# Patient Record
Sex: Male | Born: 1935 | Race: White | Hispanic: No | Smoking: Never smoker
Health system: Southern US, Community
[De-identification: ages and names within clinical notes are randomized; demographics above are authoritative.]

## PROBLEM LIST (undated history)

## (undated) DIAGNOSIS — I10 Essential (primary) hypertension: Secondary | ICD-10-CM

## (undated) DIAGNOSIS — R338 Other retention of urine: Secondary | ICD-10-CM

## (undated) DIAGNOSIS — S12100A Unspecified displaced fracture of second cervical vertebra, initial encounter for closed fracture: Secondary | ICD-10-CM

## (undated) HISTORY — DX: Other retention of urine: R33.8

## (undated) HISTORY — PX: OTHER SURGICAL HISTORY: SHX169

## (undated) HISTORY — DX: Essential (primary) hypertension: I10

## (undated) HISTORY — DX: Unspecified displaced fracture of second cervical vertebra, initial encounter for closed fracture: S12.100A

## (undated) HISTORY — PX: HERNIA REPAIR: SHX51

---

## 2013-12-09 DIAGNOSIS — S12100A Unspecified displaced fracture of second cervical vertebra, initial encounter for closed fracture: Secondary | ICD-10-CM | POA: Insufficient documentation

## 2013-12-09 HISTORY — DX: Unspecified displaced fracture of second cervical vertebra, initial encounter for closed fracture: S12.100A

## 2013-12-14 DIAGNOSIS — N400 Enlarged prostate without lower urinary tract symptoms: Secondary | ICD-10-CM | POA: Insufficient documentation

## 2013-12-14 DIAGNOSIS — I10 Essential (primary) hypertension: Secondary | ICD-10-CM

## 2013-12-14 DIAGNOSIS — R338 Other retention of urine: Secondary | ICD-10-CM

## 2013-12-14 HISTORY — DX: Other retention of urine: R33.8

## 2013-12-14 HISTORY — DX: Essential (primary) hypertension: I10

## 2015-02-10 ENCOUNTER — Encounter: Payer: Self-pay | Admitting: Urology

## 2015-02-10 ENCOUNTER — Encounter: Payer: Self-pay | Admitting: *Deleted

## 2015-02-10 ENCOUNTER — Other Ambulatory Visit: Payer: Self-pay

## 2015-02-10 ENCOUNTER — Ambulatory Visit (INDEPENDENT_AMBULATORY_CARE_PROVIDER_SITE_OTHER): Payer: Medicare Other | Admitting: Urology

## 2015-02-10 VITALS — BP 132/71 | HR 71 | Ht 70.0 in | Wt 180.6 lb

## 2015-02-10 DIAGNOSIS — N401 Enlarged prostate with lower urinary tract symptoms: Secondary | ICD-10-CM | POA: Diagnosis not present

## 2015-02-10 DIAGNOSIS — R972 Elevated prostate specific antigen [PSA]: Secondary | ICD-10-CM

## 2015-02-10 DIAGNOSIS — N138 Other obstructive and reflux uropathy: Secondary | ICD-10-CM | POA: Insufficient documentation

## 2015-02-10 DIAGNOSIS — R3913 Splitting of urinary stream: Secondary | ICD-10-CM

## 2015-02-10 DIAGNOSIS — N400 Enlarged prostate without lower urinary tract symptoms: Secondary | ICD-10-CM

## 2015-02-10 LAB — URINALYSIS, COMPLETE
Bilirubin, UA: NEGATIVE
GLUCOSE, UA: NEGATIVE
Ketones, UA: NEGATIVE
Leukocytes, UA: NEGATIVE
Nitrite, UA: NEGATIVE
PH UA: 7 (ref 5.0–7.5)
PROTEIN UA: NEGATIVE
Specific Gravity, UA: 1.015 (ref 1.005–1.030)
UUROB: 1 mg/dL (ref 0.2–1.0)

## 2015-02-10 LAB — MICROSCOPIC EXAMINATION
BACTERIA UA: NONE SEEN
Epithelial Cells (non renal): NONE SEEN /hpf (ref 0–10)

## 2015-02-10 NOTE — Progress Notes (Signed)
This is a new patient seen today for elevated PSA were for by Scott Maynard.  Patient was seen November 2016 with a PSA was 7.4. He has no family history of prostate cancer. No prior PSA or prostate biopsy.   He has a history of BPH on tamsulosin and has not. 2, hesitancy and intermittent stream at times. He's had no gross hematuria or dysuria.  Past medical and surgical history reviewed. Family history reviewed. Social history reviewed. Medication list reviewed.   13 system review of systems obtained which was negative apart from the following: Heartburn, back pain, joint pain  Physical exam: NAD GU: Circumcised penis without mass or lesion. Testicles were descended bilaterally and palpably normal, small right spermatocele palpable. On digital rectal exam the prostate was palpably normal without hard area or nodule. Approximately 50 g.    His UA is negative.    A / P:  Elevated PSA-a long discussion with the patient about the nature risk and benefits of PSA screening and the nature of elevated PSA. We discussed the management of prostate cancer might include watchful waiting, active surveillance or treatment primarily with surgery or radiation. Discussed the nature risk and benefits of surveillance versus prostate biopsy, other lab tests or imaging. Because he had a normal DRE and this is his first PSA will have him return in a couple months with a repeat PSA prior reflex to free.   BPH with lower urinary tract symptoms He'll continue tamsulosin. Optically bothered by any symptoms.

## 2015-04-17 ENCOUNTER — Other Ambulatory Visit: Payer: Medicare Other

## 2015-04-17 DIAGNOSIS — R972 Elevated prostate specific antigen [PSA]: Secondary | ICD-10-CM

## 2015-04-18 LAB — PSA TOTAL (REFLEX TO FREE): PROSTATE SPECIFIC AG, SERUM: 4.8 ng/mL — AB (ref 0.0–4.0)

## 2015-04-18 LAB — FPSA% REFLEX
% FREE PSA: 32.7 %
PSA, FREE: 1.57 ng/mL

## 2015-04-19 ENCOUNTER — Ambulatory Visit: Payer: Medicare Other

## 2015-04-21 ENCOUNTER — Telehealth: Payer: Self-pay

## 2015-04-21 NOTE — Telephone Encounter (Signed)
-----   Message from Jerilee Field, MD sent at 04/20/2015  5:03 PM EST ----- Notify patient PSA much lower. Give result. F/u as planned.

## 2015-04-21 NOTE — Telephone Encounter (Signed)
Spoke with pt in reference to PSA. Pt voiced understanding.  

## 2015-04-24 ENCOUNTER — Ambulatory Visit: Payer: Medicare Other

## 2015-04-28 ENCOUNTER — Ambulatory Visit (INDEPENDENT_AMBULATORY_CARE_PROVIDER_SITE_OTHER): Payer: Medicare Other | Admitting: Urology

## 2015-04-28 ENCOUNTER — Encounter: Payer: Self-pay | Admitting: Urology

## 2015-04-28 VITALS — BP 182/80 | HR 66 | Ht 70.0 in | Wt 183.0 lb

## 2015-04-28 DIAGNOSIS — R972 Elevated prostate specific antigen [PSA]: Secondary | ICD-10-CM

## 2015-04-28 NOTE — Progress Notes (Signed)
04/28/2015 4:09 PM   Michaela CornerBruce Lo 03/02/1930 [id number]  Referring provider: No referring provider defined for this encounter.  Chief Complaint  Patient presents with  . Elevated PSA    HPI: Patient was seen November 2016 with a PSA was 7.4. He has no family history of prostate cancer. No prior PSA or prostate biopsy. Repeat PSA is 4.8 with a free PSA of 32.7%.    He has a history of BPH on tamsulosin and has not. 2, hesitancy and intermittent stream at times. He's had no gross hematuria or dysuria.    PMH: Past Medical History  Diagnosis Date  . Essential (primary) hypertension 12/14/2013  . Fracture of second cervical vertebra (HCC) 12/09/2013  . Acute retention of urine 12/14/2013    Surgical History: Past Surgical History  Procedure Laterality Date  . Hernia repair    . Neck vertabrae      Home Medications:    Medication List       This list is accurate as of: 04/28/15  4:09 PM.  Always use your most recent med list.               Garlic 705 MG Caps  Take by mouth.     losartan-hydrochlorothiazide 100-25 MG tablet  Commonly known as:  HYZAAR  Take by mouth.     naproxen 500 MG tablet  Commonly known as:  NAPROSYN     Omega-3 1000 MG Caps  Take by mouth.     pantoprazole 40 MG tablet  Commonly known as:  PROTONIX  Take 40 mg by mouth.     tamsulosin 0.4 MG Caps capsule  Commonly known as:  FLOMAX  Take 0.4 mg by mouth.     terbinafine 250 MG tablet  Commonly known as:  LAMISIL     vitamin C 1000 MG tablet  Take by mouth. Reported on 02/10/2015        Allergies: No Known Allergies  Family History: Family History  Problem Relation Age of Onset  . Hematuria Neg Hx   . Prostate cancer Neg Hx   . Kidney cancer Neg Hx     Social History:  reports that he has never smoked. He does not have any smokeless tobacco history on file. He reports that he does not drink alcohol or use illicit drugs.  ROS: UROLOGY Frequent Urination?:  No Hard to postpone urination?: No Burning/pain with urination?: No Get up at night to urinate?: Yes Leakage of urine?: No Urine stream starts and stops?: No Trouble starting stream?: No Do you have to strain to urinate?: No Blood in urine?: No Urinary tract infection?: No Sexually transmitted disease?: No Injury to kidneys or bladder?: No Painful intercourse?: No Weak stream?: No Erection problems?: No Penile pain?: No  Gastrointestinal Nausea?: No Vomiting?: No Indigestion/heartburn?: No Diarrhea?: No Constipation?: No  Constitutional Fever: No Night sweats?: No Weight loss?: No Fatigue?: No  Skin Skin rash/lesions?: No Itching?: No  Eyes Blurred vision?: No Double vision?: No  Ears/Nose/Throat Sore throat?: No Sinus problems?: No  Hematologic/Lymphatic Swollen glands?: No Easy bruising?: No  Cardiovascular Leg swelling?: No Chest pain?: No  Respiratory Cough?: No Shortness of breath?: No  Endocrine Excessive thirst?: No  Musculoskeletal Back pain?: No Joint pain?: No  Neurological Headaches?: No Dizziness?: No  Psychologic Depression?: No Anxiety?: No  Physical Exam: BP 182/80 mmHg  Pulse 66  Ht 5\' 10"  (1.778 m)  Wt 183 lb (83.008 kg)  BMI 26.26 kg/m2  Constitutional:  Alert  and oriented, No acute distress. HEENT: [place] AT, moist mucus membranes.  Trachea midline, no masses. Cardiovascular: No clubbing, cyanosis, or edema. Respiratory: Normal respiratory effort, no increased work of breathing. GI: Abdomen is soft, nontender, nondistended, no abdominal masses GU: No CVA tenderness. Skin: No rashes, bruises or suspicious lesions. Lymph: No cervical or inguinal adenopathy. Neurologic: Grossly intact, no focal deficits, moving all 4 extremities. Psychiatric: Normal mood and affect.  Laboratory Data: No results found for: WBC, HGB, HCT, MCV, PLT  No results found for: CREATININE  No results found for: PSA  No results found for:  TESTOSTERONE  No results found for: HGBA1C  Urinalysis    Component Value Date/Time   GLUCOSEU Negative 02/10/2015 0921   BILIRUBINUR Negative 02/10/2015 0921   NITRITE Negative 02/10/2015 0921   LEUKOCYTESUR Negative 02/10/2015 0921     Assessment & Plan:     1. Elevated PSA  renal long discussion regarding the patient's elevated PSA. We discussed his age and is relatively low PSA of currently of only 4.8 (free PSA 37%) with a negative DRE. We discussed the likelihood of him having clinically significant prostate cancer in his lifetime is extremely low. I think it would be absolutely reasonable for the patient to stop checking PSA all together. I discussed this with the patient.  I also offered repeating his PSA in one year to offer him piece of mind. He does understand there is an extremely low chance of developing clinically significant prostate cancer in his lifetime, however he has elected to repeat his PSA in 1 year's time.  2. BPH with lower urinary tract symptoms -Continue flomax  Return in about 1 year (around 04/27/2016) for psa prior.  Hildred Laser, MD  [facility] [address], [address] [place], [place] [address] [phone]

## 2016-04-24 ENCOUNTER — Other Ambulatory Visit: Payer: Medicare Other

## 2016-04-26 ENCOUNTER — Ambulatory Visit: Payer: Medicare Other

## 2016-07-19 ENCOUNTER — Encounter: Payer: Self-pay | Admitting: Urology

## 2016-07-19 ENCOUNTER — Ambulatory Visit (INDEPENDENT_AMBULATORY_CARE_PROVIDER_SITE_OTHER): Payer: Medicare Other | Admitting: Urology

## 2016-07-19 VITALS — BP 165/83 | HR 75 | Ht 70.0 in | Wt 178.5 lb

## 2016-07-19 DIAGNOSIS — N401 Enlarged prostate with lower urinary tract symptoms: Secondary | ICD-10-CM

## 2016-07-19 DIAGNOSIS — N138 Other obstructive and reflux uropathy: Secondary | ICD-10-CM | POA: Diagnosis not present

## 2016-07-19 LAB — URINALYSIS, COMPLETE
Bilirubin, UA: NEGATIVE
GLUCOSE, UA: NEGATIVE
Leukocytes, UA: NEGATIVE
NITRITE UA: NEGATIVE
RBC, UA: NEGATIVE
SPEC GRAV UA: 1.02 (ref 1.005–1.030)
UUROB: 1 mg/dL (ref 0.2–1.0)
pH, UA: 6 (ref 5.0–7.5)

## 2016-07-19 LAB — MICROSCOPIC EXAMINATION
BACTERIA UA: NONE SEEN
RBC, UA: NONE SEEN /hpf (ref 0–?)

## 2016-07-19 LAB — BLADDER SCAN AMB NON-IMAGING: SCAN RESULT: 85

## 2016-07-19 MED ORDER — FINASTERIDE 5 MG PO TABS: 5.0000 mg | ORAL_TABLET | Freq: Every day | ORAL | 11 refills | Status: DC

## 2016-07-19 MED ORDER — FINASTERIDE 5 MG PO TABS: 5.0000 mg | ORAL_TABLET | Freq: Every day | ORAL | 11 refills | Status: AC

## 2016-07-19 MED ORDER — TAMSULOSIN HCL 0.4 MG PO CAPS: 0.8000 mg | ORAL_CAPSULE | Freq: Every day | ORAL | 11 refills | Status: DC

## 2016-07-19 MED ORDER — TAMSULOSIN HCL 0.4 MG PO CAPS: 0.8000 mg | ORAL_CAPSULE | Freq: Every day | ORAL | 11 refills | Status: AC

## 2016-07-19 NOTE — Progress Notes (Signed)
07/19/2016 11:29 AM   Scott Maynard [birth year] [id number]  Referring provider: No referring provider defined for this encounter.  Chief Complaint  Patient presents with  . Benign Prostatic Hypertrophy    HPI: The patient is an [age] year old gentleman with a past medical history the BPH on Flomax who presents today with a chief complaint of difficulty with urination.  The patient's IPSS score today is 21/3. Has nocturia 2. His biggest complaints are feeling of incomplete emptying, frequency, and weak stream. He does have moderate intermittency and urgency. This all started relatively recently. He was started on spironolactone recently for high blood pressure. He was [facility] with [facility] blood pressure. At this point, his Flomax was decreased from 0.8 mg to 0.4 mg. He noticed a significant worsening of his symptoms at this time. He feels that his symptoms are much better a few weeks ago.   PMH: Past Medical History:  Diagnosis Date  . Acute retention of urine 12/14/2013  . Essential (primary) hypertension 12/14/2013  . Fracture of second cervical vertebra (HCC) 12/09/2013    Surgical History: Past Surgical History:  Procedure Laterality Date  . HERNIA REPAIR    . neck vertabrae      Home Medications:  Allergies as of 07/19/2016   No Known Allergies     Medication List       Accurate as of 07/19/16 11:29 AM. Always use your most recent med list.          finasteride 5 MG tablet Commonly known as:  PROSCAR Take 1 tablet (5 mg total) by mouth daily.   Garlic 705 MG Caps Take by mouth.   losartan-hydrochlorothiazide 100-25 MG tablet Commonly known as:  HYZAAR Take by mouth.   naproxen 500 MG tablet Commonly known as:  NAPROSYN   Omega-3 1000 MG Caps Take by mouth.   pantoprazole 40 MG tablet Commonly known as:  PROTONIX Take 40 mg by mouth.   spironolactone 25 MG tablet Commonly known as:  ALDACTONE Take 25 mg by mouth daily.     tamsulosin 0.4 MG Caps capsule Commonly known as:  FLOMAX Take 0.4 mg by mouth.   tamsulosin 0.4 MG Caps capsule Commonly known as:  FLOMAX Take 2 capsules (0.8 mg total) by mouth daily.   terbinafine 250 MG tablet Commonly known as:  LAMISIL   vitamin C 1000 MG tablet Take by mouth. Reported on 02/10/2015       Allergies: No Known Allergies  Family History: Family History  Problem Relation Age of Onset  . Hematuria Neg Hx   . Prostate cancer Neg Hx   . Kidney cancer Neg Hx     Social History:  reports that he has never smoked. He has never used smokeless tobacco. He reports that he does not drink alcohol or use drugs.  ROS: UROLOGY Frequent Urination?: Yes Hard to postpone urination?: No Burning/pain with urination?: Yes Get up at night to urinate?: Yes Leakage of urine?: No Urine stream starts and stops?: Yes Trouble starting stream?: Yes Do you have to strain to urinate?: No Blood in urine?: No Urinary tract infection?: No Sexually transmitted disease?: No Injury to kidneys or bladder?: No Painful intercourse?: No Weak stream?: No Erection problems?: Yes Penile pain?: No  Gastrointestinal Nausea?: No Vomiting?: No Indigestion/heartburn?: No Diarrhea?: No Constipation?: No  Constitutional Fever: No Night sweats?: No Weight loss?: No Fatigue?: No  Skin Skin rash/lesions?: No Itching?: No  Eyes Blurred vision?: No Double vision?:  No  Ears/Nose/Throat Sore throat?: No Sinus problems?: No  Hematologic/Lymphatic Swollen glands?: No Easy bruising?: No  Cardiovascular Leg swelling?: No Chest pain?: No  Respiratory Cough?: No Shortness of breath?: No  Endocrine Excessive thirst?: No  Musculoskeletal Back pain?: Yes Joint pain?: No  Neurological Headaches?: No Dizziness?: No  Psychologic Depression?: No Anxiety?: No  Physical Exam: BP (!) 165/83 (BP Location: Left Arm, Patient Position: Sitting, Cuff Size: Normal)    Pulse 75   Ht 5\' 10"  (1.778 m)   Wt 178 lb 8 oz (81 kg)   BMI 25.61 kg/m   Constitutional:  Alert and oriented, No acute distress. HEENT:  AT, moist mucus membranes.  Trachea midline, no masses. Cardiovascular: No clubbing, cyanosis, or edema. Respiratory: Normal respiratory effort, no increased work of breathing. GI: Abdomen is soft, nontender, nondistended, no abdominal masses GU: No CVA tenderness.  Skin: No rashes, bruises or suspicious lesions. Lymph: No cervical or inguinal adenopathy. Neurologic: Grossly intact, no focal deficits, moving all 4 extremities. Psychiatric: Normal mood and affect.  Laboratory Data: No results found for: WBC, HGB, HCT, MCV, PLT  No results found for: CREATININE  No results found for: PSA  No results found for: TESTOSTERONE  No results found for: HGBA1C  Urinalysis    Component Value Date/Time   APPEARANCEUR Clear 02/10/2015 0921   GLUCOSEU Negative 02/10/2015 0921   BILIRUBINUR Negative 02/10/2015 0921   PROTEINUR Negative 02/10/2015 0921   NITRITE Negative 02/10/2015 0921   LEUKOCYTESUR Negative 02/10/2015 0921    Assessment & Plan:    1. BPH I discussed with the patient that the likely relatively recent worsening of symptoms are probably combination of his spironolactone causing increased urine production thus greater frequency as well as his Flomax being decreased in dosage. His blood pressure today is actually on the high side, so I think it would be reasonable to increase his Flomax back to 0.8 mg daily. Have also started him on finasteride. He was cautioned that the finasteride will take up to 6 weeks to have a noticeable effect and up to 6 months to have maximal effect. He'll follow-up in 3 months to assess his progress.  Return in about 3 months (around 10/19/2016).  Hildred Laser, MD  [facility] [address] [place], [place] [address] [phone]

## 2016-07-19 NOTE — Addendum Note (Signed)
Addended by: Doree BarthelLOWE, CASANDRA on: 07/19/2016 11:37 AM   Modules accepted: Orders

## 2016-10-18 ENCOUNTER — Ambulatory Visit (INDEPENDENT_AMBULATORY_CARE_PROVIDER_SITE_OTHER): Payer: Medicare Other | Admitting: Urology

## 2016-10-18 VITALS — BP 167/83 | HR 89 | Ht 70.0 in | Wt 182.9 lb

## 2016-10-18 DIAGNOSIS — N400 Enlarged prostate without lower urinary tract symptoms: Secondary | ICD-10-CM

## 2016-10-18 NOTE — Progress Notes (Signed)
10/18/2016 2:41 PM   Scott Maynard 1931/01/02 [id number]  Referring provider: No referring provider defined for this encounter.  Chief Complaint  Patient presents with  . Follow-up    3 months    HPI: The patient is an 83 year old gentleman with a past medical history the BPH on Flomax who presents today with a chief complaint of difficulty with urination.  The patient's IPSS score today is 21/3. Has nocturia 2. His biggest complaints are feeling of incomplete emptying, frequency, and weak stream. He does have moderate intermittency and urgency. This all started relatively recently. He was started on spironolactone recently for high blood pressure. He was then admitted to the hospital at [facility] with low blood pressure. At this point, his Flomax was decreased from 0.8 mg to 0.4 mg. He noticed a significant worsening of his symptoms at this time. He feels that his symptoms are much better a few weeks ago.    Since his blood pressure is high at the time of his last visit, his Flomax was increased back to 0.8 mg daily. He was also started on finasteride. His symptoms have dramatically improved. His only complaint now is nocturia 2. He does strain to start urination at night only. Otherwise he feels he empties his bladder without straining. He has a good stream. He is overall happy with his urinary quality of life.  The patient did note today that his wife did pass away in March 2018, and he now lives by himself   PMH: Past Medical History:  Diagnosis Date  . Acute retention of urine 12/14/2013  . Essential (primary) hypertension 12/14/2013  . Fracture of second cervical vertebra (HCC) 12/09/2013    Surgical History: Past Surgical History:  Procedure Laterality Date  . HERNIA REPAIR    . neck vertabrae      Home Medications:  Allergies as of 10/18/2016   No Known Allergies     Medication List       Accurate as of 10/18/16  2:41 PM. Always use your most recent med list.          finasteride 5 MG tablet Commonly known as:  PROSCAR Take 1 tablet (5 mg total) by mouth daily.   Garlic 705 MG Caps Take by mouth.   losartan-hydrochlorothiazide 100-25 MG tablet Commonly known as:  HYZAAR Take by mouth.   Omega-3 1000 MG Caps Take by mouth.   spironolactone 25 MG tablet Commonly known as:  ALDACTONE Take 25 mg by mouth daily.   tamsulosin 0.4 MG Caps capsule Commonly known as:  FLOMAX Take 2 capsules (0.8 mg total) by mouth daily.   vitamin B-12 100 MCG tablet Commonly known as:  CYANOCOBALAMIN Take 100 mcg by mouth daily.   vitamin C 1000 MG tablet Take by mouth. Reported on 02/10/2015   Vitamin D3 1000 units Caps Take by mouth.       Allergies: No Known Allergies  Family History: Family History  Problem Relation Age of Onset  . Hematuria Neg Hx   . Prostate cancer Neg Hx   . Kidney cancer Neg Hx     Social History:  reports that he has never smoked. He has never used smokeless tobacco. He reports that he does not drink alcohol or use drugs.  ROS: UROLOGY Frequent Urination?: No Hard to postpone urination?: No Burning/pain with urination?: No Get up at night to urinate?: Yes Leakage of urine?: No Urine stream starts and stops?: Yes Trouble starting stream?: Yes Do you have to  strain to urinate?: No Blood in urine?: No Urinary tract infection?: No Sexually transmitted disease?: No Injury to kidneys or bladder?: No Painful intercourse?: No Weak stream?: No Erection problems?: Yes Penile pain?: No  Gastrointestinal Nausea?: No Vomiting?: No Indigestion/heartburn?: No Diarrhea?: No Constipation?: No  Constitutional Fever: No Night sweats?: No Weight loss?: No Fatigue?: No  Skin Skin rash/lesions?: No Itching?: No  Eyes Blurred vision?: No Double vision?: No  Ears/Nose/Throat Sore throat?: No Sinus problems?: No  Hematologic/Lymphatic Swollen glands?: No Easy bruising?: No  Cardiovascular Leg swelling?:  No Chest pain?: No  Respiratory Cough?: No Shortness of breath?: No  Endocrine Excessive thirst?: No  Musculoskeletal Back pain?: Yes Joint pain?: Yes  Neurological Headaches?: No Dizziness?: Yes  Psychologic Depression?: No Anxiety?: No  Physical Exam: BP (!) 167/83 (BP Location: Left Arm, Patient Position: Sitting, Cuff Size: Normal)   Pulse 89   Ht 5\' 10"  (1.778 m)   Wt 182 lb 14.4 oz (83 kg)   BMI 26.24 kg/m   Constitutional:  Alert and oriented, No acute distress. HEENT: [place] AT, moist mucus membranes.  Trachea midline, no masses. Cardiovascular: No clubbing, cyanosis, or edema. Respiratory: Normal respiratory effort, no increased work of breathing. GI: Abdomen is soft, nontender, nondistended, no abdominal masses GU: No CVA tenderness.  Skin: No rashes, bruises or suspicious lesions. Lymph: No cervical or inguinal adenopathy. Neurologic: Grossly intact, no focal deficits, moving all 4 extremities. Psychiatric: Normal mood and affect.  Laboratory Data: No results found for: WBC, HGB, HCT, MCV, PLT  No results found for: CREATININE  No results found for: PSA  No results found for: TESTOSTERONE  No results found for: HGBA1C  Urinalysis    Component Value Date/Time   APPEARANCEUR Clear 07/19/2016 1059   GLUCOSEU Negative 07/19/2016 1059   BILIRUBINUR Negative 07/19/2016 1059   PROTEINUR Trace (A) 07/19/2016 1059   NITRITE Negative 07/19/2016 1059   LEUKOCYTESUR Negative 07/19/2016 1059    Assessment & Plan:    1. BPH The patient is doing well on Flomax or 0.8 mg daily and finasteride. He will follow-up with [place] in one year or sooner if needed.  Return in about 1 year (around 10/18/2017).  Hildred Laser, MD  [facility] [address] [place], [place] [address] [phone]

## 2017-10-17 ENCOUNTER — Ambulatory Visit: Payer: Medicare Other | Admitting: Urology

## 2017-12-18 ENCOUNTER — Emergency Department: Payer: Medicare Other

## 2017-12-18 ENCOUNTER — Other Ambulatory Visit: Payer: Self-pay

## 2017-12-18 ENCOUNTER — Encounter: Payer: Self-pay | Admitting: Emergency Medicine

## 2017-12-18 ENCOUNTER — Emergency Department
Admission: EM | Admit: 2017-12-18 | Discharge: 2017-12-18 | Disposition: A | Discharge status: Home / Self Care | Payer: Medicare Other | Attending: Emergency Medicine | Admitting: Emergency Medicine

## 2017-12-18 DIAGNOSIS — R079 Chest pain, unspecified: Secondary | ICD-10-CM | POA: Diagnosis not present

## 2017-12-18 DIAGNOSIS — R338 Other retention of urine: Secondary | ICD-10-CM | POA: Insufficient documentation

## 2017-12-18 DIAGNOSIS — Z79899 Other long term (current) drug therapy: Secondary | ICD-10-CM | POA: Diagnosis not present

## 2017-12-18 DIAGNOSIS — R5383 Other fatigue: Secondary | ICD-10-CM | POA: Diagnosis not present

## 2017-12-18 DIAGNOSIS — N401 Enlarged prostate with lower urinary tract symptoms: Secondary | ICD-10-CM | POA: Insufficient documentation

## 2017-12-18 DIAGNOSIS — I10 Essential (primary) hypertension: Secondary | ICD-10-CM | POA: Diagnosis not present

## 2017-12-18 DIAGNOSIS — R531 Weakness: Secondary | ICD-10-CM | POA: Diagnosis present

## 2017-12-18 LAB — CBC
HCT: 43.2 % (ref 39.0–52.0)
Hemoglobin: 15.3 g/dL (ref 13.0–17.0)
MCH: 31.9 pg (ref 26.0–34.0)
MCHC: 35.4 g/dL (ref 30.0–36.0)
MCV: 90.2 fL (ref 80.0–100.0)
NRBC: 0 % (ref 0.0–0.2)
PLATELETS: 182 10*3/uL (ref 150–400)
RBC: 4.79 MIL/uL (ref 4.22–5.81)
RDW: 12.4 % (ref 11.5–15.5)
WBC: 7.4 10*3/uL (ref 4.0–10.5)

## 2017-12-18 LAB — BASIC METABOLIC PANEL
ANION GAP: 9 (ref 5–15)
BUN: 11 mg/dL (ref 8–23)
CALCIUM: 8.9 mg/dL (ref 8.9–10.3)
CO2: 28 mmol/L (ref 22–32)
Chloride: 95 mmol/L — ABNORMAL LOW (ref 98–111)
Creatinine, Ser: 0.94 mg/dL (ref 0.61–1.24)
Glucose, Bld: 116 mg/dL — ABNORMAL HIGH (ref 70–99)
POTASSIUM: 4 mmol/L (ref 3.5–5.1)
Sodium: 132 mmol/L — ABNORMAL LOW (ref 135–145)

## 2017-12-18 LAB — URINALYSIS, COMPLETE (UACMP) WITH MICROSCOPIC
Bacteria, UA: NONE SEEN
Bilirubin Urine: NEGATIVE
GLUCOSE, UA: NEGATIVE mg/dL
HGB URINE DIPSTICK: NEGATIVE
KETONES UR: NEGATIVE mg/dL
LEUKOCYTES UA: NEGATIVE
NITRITE: NEGATIVE
PH: 7 (ref 5.0–8.0)
Protein, ur: NEGATIVE mg/dL
Specific Gravity, Urine: 1.011 (ref 1.005–1.030)
Squamous Epithelial / LPF: NONE SEEN (ref 0–5)
WBC, UA: NONE SEEN WBC/hpf (ref 0–5)

## 2017-12-18 LAB — TROPONIN I: Troponin I: <0.03 ng/mL (ref <0.03)

## 2017-12-18 NOTE — ED Triage Notes (Signed)
Pt reports this am he woke up early and then had to urinate about every 30 minutes.

## 2017-12-18 NOTE — Discharge Instructions (Addendum)
You are evaluated for fatigue and chest pain and although no certain cause was found, your exam and evaluation are overall reassuring in the emergency department today.  Return to the emergency department immediately for any worsening condition including chest pain, nausea, sweats, dizziness passing out, trouble breathing, weakness, numbness, or any other symptoms concerning to you.  Please follow-up with your primary care doctor about additional causes of fatigue which might include vitamin D or vitamin B12, etc.  You do need to follow with a cardiologist, the on-call cardiologist is Dr. Gwen Pounds with [facility] cardiology, but have also given you the number for Dr. Windell Hummingbird office [facility]) -or certainly you could see the cardiologist group in [place].

## 2017-12-18 NOTE — ED Triage Notes (Signed)
Pt reports has been feeling weak for the past week. Pt states this am he has some pain to left side of his chest and his left arm. Pt reports was seen at [facility] ED for the same a few days ago.

## 2017-12-18 NOTE — ED Provider Notes (Signed)
[facility] Provider Note ____________________________________________   I have reviewed the triage vital signs and the triage nursing note.  HISTORY  Chief Complaint Weakness and Chest Pain   Historian Patient and son at the bedside  HPI Scott Maynard is a 84 y.o. male presents with a complaint of nausea and left-sided chest pain that went to his arm this morning.  He has had generalized weakness for about a week now.  No recent illnesses.  No fever.  No congestion or sinus allergies.  No shortness of breath or coughing.  No focal weakness or numbness of the extremities.  No slurred speech.  Patient was seen at [facility] ER on Monday for symptoms of generalized weakness and states that his blood pressure was elevated and he increased from 2.5 mg twice daily of amlodipine to 5 mill grams twice daily of amlodipine.  The reason he came today was persistence of fatigue generally, but mainly for the chest discomfort which was mild and did not last that long but made him concerned.  He does have a cardiologist and has an appointment next Monday already scheduled.  Reports echo done last year, but never had a stress test.     Past Medical History:  Diagnosis Date  . Acute retention of urine 12/14/2013  . Essential (primary) hypertension 12/14/2013  . Fracture of second cervical vertebra (HCC) 12/09/2013    Patient Active Problem List   Diagnosis Date Noted  . 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 Intermittent urinary stream 02/10/2015  . Elevated PSA 02/10/2015  . Essential (primary) hypertension 12/14/2013  . Fracture of second cervical vertebra (HCC) 12/09/2013    Past Surgical History:  Procedure Laterality Date  . HERNIA REPAIR    . neck vertabrae      Prior to Admission medications   Medication Sig Start Date End Date Taking? Authorizing Provider  amLODipine (NORVASC) 2.5 MG tablet Take 2.5 mg by mouth  daily. 12/09/17  Yes [provider]  Ascorbic Acid (VITAMIN C) 1000 MG tablet Take by mouth. Reported on 02/10/2015    [provider]  Cholecalciferol (VITAMIN D3) 1000 units CAPS Take by mouth.    [provider]  finasteride (PROSCAR) 5 MG tablet Take 1 tablet (5 mg total) by mouth daily. 07/19/16   Hildred Laser, MD  Garlic 705 MG CAPS Take by mouth.    [provider]  losartan-hydrochlorothiazide (HYZAAR) 100-25 MG tablet Take by mouth. 05/30/14   [provider]  Omega-3 1000 MG CAPS Take by mouth.    [provider]  spironolactone (ALDACTONE) 25 MG tablet Take 25 mg by mouth daily.    [provider]  tamsulosin (FLOMAX) 0.4 MG CAPS capsule Take 2 capsules (0.8 mg total) by mouth daily. 07/19/16   Hildred Laser, MD  vitamin B-12 (CYANOCOBALAMIN) 100 MCG tablet Take 100 mcg by mouth daily.    [provider]    No Known Allergies  Family History  Problem Relation Age of Onset  . Hematuria Neg Hx   . Prostate cancer Neg Hx   . Kidney cancer Neg Hx     Social History Social History   Tobacco Use  . Smoking status: Never Smoker  . Smokeless tobacco: Never Used  Substance Use Topics  . Alcohol use: No    Alcohol/week: 0.0 standard drinks  . Drug use: No    Review of Systems  Constitutional: Negative for fever. Eyes: Negative for visual  changes. ENT: Negative for sore throat. Cardiovascular: As per HPI positive for chest pain. Respiratory: Negative for shortness of breath. Gastrointestinal: Negative for abdominal pain, vomiting and diarrhea. Genitourinary: Negative for dysuria.  Does have frequency of urination which is chronic and felt to be due to prostate issues. Musculoskeletal: Negative for back pain. Skin: Negative for rash. Neurological: Negative for headache.  ____________________________________________   PHYSICAL EXAM:  VITAL SIGNS: ED Triage Vitals  Enc Vitals Group      BP 12/18/17 0838 (!) 171/70     Pulse Rate 12/18/17 0838 73     Resp 12/18/17 0838 18     Temp 12/18/17 0838 97.7 F (36.5 C)     Temp Source 12/18/17 0838 Oral     SpO2 12/18/17 0838 97 %     Weight 12/18/17 0836 185 lb (83.9 kg)     Height 12/18/17 0836 5\' 8"  (1.727 m)     Head Circumference --      Peak Flow --      Pain Score 12/18/17 0836 5     Pain Loc --      Pain Edu? --      Excl. in GC? --      Constitutional: Alert and oriented.  HEENT      Head: Normocephalic and atraumatic.      Eyes: Conjunctivae are normal. Pupils equal and round.       Ears:         Nose: No congestion/rhinnorhea.      Mouth/Throat: Mucous membranes are moist.      Neck: No stridor. Cardiovascular/Chest: Normal rate, regular rhythm.  No murmurs, rubs, or gallops. Respiratory: Normal respiratory effort without tachypnea nor retractions. Breath sounds are clear and equal bilaterally. No wheezes/rales/rhonchi. Gastrointestinal: Soft. No distention, no guarding, no rebound. Nontender.    Genitourinary/rectal:Deferred Musculoskeletal: Nontender with normal range of motion in all extremities. No joint effusions.  No lower extremity tenderness.  No edema. Neurologic:  Normal speech and language. No gross or focal neurologic deficits are appreciated. Skin:  Skin is warm, dry and intact. No rash noted. Psychiatric: Mood and affect are normal. Speech and behavior are normal. Patient exhibits appropriate insight and judgment.   ____________________________________________  LABS (pertinent positives/negatives) I, Governor Rooks, MD the attending physician have reviewed the labs noted below.  Labs Reviewed  BASIC METABOLIC PANEL - Abnormal; Notable for the following components:      Result Value   Sodium 132 (*)    Chloride 95 (*)    Glucose, Bld 116 (*)    All other components within normal limits  URINALYSIS, COMPLETE (UACMP) WITH MICROSCOPIC - Abnormal; Notable for the following components:    Color, Urine YELLOW (*)    APPearance CLEAR (*)    All other components within normal limits  CBC  TROPONIN I    ____________________________________________    EKG I, Governor Rooks, MD, the attending physician have personally viewed and interpreted all ECGs.  77 bpm.  Normal sinus rhythm.  Narrow QS.  Left axis.  Normal ST and T wave. ____________________________________________  RADIOLOGY   Chest x-ray two-view: No edema or consolidation. __________________________________________  PROCEDURES  Procedure(s) performed: None  Procedures  Critical Care performed: None   ____________________________________________  ED COURSE / ASSESSMENT AND PLAN  Pertinent labs & imaging results that were available during my care of the patient were reviewed by me and considered in my medical decision making (see chart for details).     In terms of  the generalized fatigue, sound like he had a work-up a couple of days ago, and there is nothing clinically change with regard to that.  He is not reporting symptoms of GI bleeding, dehydration, infection respiratory or GI.  He has not been on any new medications other than change in his amlodipine dose due to elevated blood pressures on Monday.  Blood pressure today is around 150/80.  He was here because of the chest pain episode this morning.  No ongoing chest pain.  EKG shows a PVC but otherwise normal sinus rhythm.  He does follow-up with a cardiologist.  Laboratory studies are reassuring with hemoglobin of 15, white blood count is normal.  Elect lites normal.  Troponin negative.  No ongoing chest pain.  Symptoms had started around 3am -- so approx 5 hours prior to blood draw, I do not see additional value in repeat troponin at this point with no symptoms.  Discussed return precautions and follow up plan.    CONSULTATIONS:   None  Patient / Family / Caregiver informed of clinical course, medical decision-making process, and agree with  plan.   I discussed return precautions, follow-up instructions, and discharge instructions with patient and/or family.  Discharge Instructions : You are evaluated for fatigue and chest pain and although no certain cause was found, your exam and evaluation are overall reassuring in the emergency department today.  Return to the emergency department immediately for any worsening condition including chest pain, nausea, sweats, dizziness passing out, trouble breathing, weakness, numbness, or any other symptoms concerning to you.  Please follow-up with your primary care doctor about additional causes of fatigue which might include vitamin D or vitamin B12, etc.  You do need to follow with a cardiologist, the on-call cardiologist is Dr. Gwen Pounds with [facility] cardiology, but have also given you the number for Dr. Windell Hummingbird office [facility]) -or certainly you could see the cardiologist group in [place].  ___________________________________________   FINAL CLINICAL IMPRESSION(S) / ED DIAGNOSES   Final diagnoses:  Fatigue, unspecified type  Chest pain, unspecified type      ___________________________________________         Note: This dictation was prepared with Dragon dictation. Any transcriptional errors that result from this process are unintentional    Governor Rooks, MD 12/18/17 1236

## 2017-12-29 ENCOUNTER — Emergency Department
Admission: EM | Admit: 2017-12-29 | Discharge: 2017-12-29 | Disposition: A | Discharge status: Home / Self Care | Payer: Medicare Other | Attending: Emergency Medicine | Admitting: Emergency Medicine

## 2017-12-29 ENCOUNTER — Emergency Department: Payer: Medicare Other

## 2017-12-29 ENCOUNTER — Encounter: Payer: Self-pay | Admitting: Emergency Medicine

## 2017-12-29 ENCOUNTER — Other Ambulatory Visit: Payer: Self-pay

## 2017-12-29 DIAGNOSIS — R079 Chest pain, unspecified: Secondary | ICD-10-CM | POA: Diagnosis not present

## 2017-12-29 DIAGNOSIS — I10 Essential (primary) hypertension: Secondary | ICD-10-CM | POA: Diagnosis not present

## 2017-12-29 DIAGNOSIS — I209 Angina pectoris, unspecified: Secondary | ICD-10-CM

## 2017-12-29 DIAGNOSIS — Z79899 Other long term (current) drug therapy: Secondary | ICD-10-CM | POA: Insufficient documentation

## 2017-12-29 DIAGNOSIS — R110 Nausea: Secondary | ICD-10-CM | POA: Diagnosis present

## 2017-12-29 LAB — CBC
HCT: 42.1 % (ref 39.0–52.0)
Hemoglobin: 14.7 g/dL (ref 13.0–17.0)
MCH: 31.7 pg (ref 26.0–34.0)
MCHC: 34.9 g/dL (ref 30.0–36.0)
MCV: 90.9 fL (ref 80.0–100.0)
PLATELETS: 189 10*3/uL (ref 150–400)
RBC: 4.63 MIL/uL (ref 4.22–5.81)
RDW: 12.4 % (ref 11.5–15.5)
WBC: 8.7 10*3/uL (ref 4.0–10.5)
nRBC: 0 % (ref 0.0–0.2)

## 2017-12-29 LAB — BASIC METABOLIC PANEL
Anion gap: 10 (ref 5–15)
BUN: 9 mg/dL (ref 8–23)
CALCIUM: 8.8 mg/dL — AB (ref 8.9–10.3)
CO2: 24 mmol/L (ref 22–32)
CREATININE: 0.89 mg/dL (ref 0.61–1.24)
Chloride: 98 mmol/L (ref 98–111)
GLUCOSE: 107 mg/dL — AB (ref 70–99)
POTASSIUM: 3.9 mmol/L (ref 3.5–5.1)
SODIUM: 132 mmol/L — AB (ref 135–145)

## 2017-12-29 LAB — TROPONIN I
Troponin I: <0.03 ng/mL (ref <0.03)
Troponin I: <0.03 ng/mL (ref <0.03)

## 2017-12-29 MED ORDER — NITROGLYCERIN 0.4 MG SL SUBL: 0.4000 mg | SUBLINGUAL_TABLET | SUBLINGUAL | 3 refills | Status: DC | PRN

## 2017-12-29 MED ORDER — IOPAMIDOL (ISOVUE-370) INJECTION 76%
75.0000 mL | Freq: Once | INTRAVENOUS | Status: AC | PRN
  Administered 2017-12-29: 75 mL via INTRAVENOUS

## 2017-12-29 NOTE — ED Triage Notes (Addendum)
Pt arrived via POV with family with reports of left chest tightness and nausea that has been going on for a few weeks. Pt states his face also gets flushed. Pt states he has been having urinary frequency as well which started 2-3 weeks ago.  Denies any dysuria.   Pt scheduled for ECHO and Stress Test on 11/15.  Pt trying to get new PCP.  Son states the patient will get better for a few days and then the pain worsens.

## 2017-12-29 NOTE — ED Provider Notes (Signed)
[facility] Emergency Department Provider Note   ____________________________________________    I have reviewed the triage vital signs and the nursing notes.   HISTORY  Chief Complaint Chest Pain and Nausea     HPI Scott Maynard is a 84 y.o. male who presents with primary complaint of nausea and "feeling ill.  Occasionally he also has chest pain which is in the left lateral chest.  Symptoms have been intermittent over the last 1 to 2 months.  Saw cardiology last week and is scheduled for echo and stress test.  Denies chest pain currently and feels well.  Nausea and feeling "ill" years to occur with exertion.  Diagnosed with angina by cardiology.   Past Medical History:  Diagnosis Date  . Acute retention of urine 12/14/2013  . Essential (primary) hypertension 12/14/2013  . Fracture of second cervical vertebra (HCC) 12/09/2013    Patient Active Problem List   Diagnosis Date Noted  . 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 Intermittent urinary stream 02/10/2015  . Elevated PSA 02/10/2015  . Essential (primary) hypertension 12/14/2013  . Fracture of second cervical vertebra (HCC) 12/09/2013    Past Surgical History:  Procedure Laterality Date  . HERNIA REPAIR    . neck vertabrae      Prior to Admission medications   Medication Sig Start Date End Date Taking? Authorizing Provider  amLODipine (NORVASC) 2.5 MG tablet Take 2.5 mg by mouth daily. 12/09/17   [provider]  Ascorbic Acid (VITAMIN C) 1000 MG tablet Take by mouth. Reported on 02/10/2015    [provider]  Cholecalciferol (VITAMIN D3) 1000 units CAPS Take by mouth.    [provider]  finasteride (PROSCAR) 5 MG tablet Take 1 tablet (5 mg total) by mouth daily. 07/19/16   Hildred Laser, MD  Garlic 705 MG CAPS Take by mouth.    [provider]  losartan-hydrochlorothiazide (HYZAAR) 100-25 MG tablet Take by mouth. 05/30/14    [provider]  nitroGLYCERIN (NITROSTAT) 0.4 MG SL tablet Place 1 tablet (0.4 mg total) under the tongue every 5 (five) minutes as needed for chest pain. 12/29/17 12/29/18  Jene Every, MD  Omega-3 1000 MG CAPS Take by mouth.    [provider]  spironolactone (ALDACTONE) 25 MG tablet Take 25 mg by mouth daily.    [provider]  tamsulosin (FLOMAX) 0.4 MG CAPS capsule Take 2 capsules (0.8 mg total) by mouth daily. 07/19/16   Hildred Laser, MD  vitamin B-12 (CYANOCOBALAMIN) 100 MCG tablet Take 100 mcg by mouth daily.    [provider]     Allergies Patient has no known allergies.  Family History  Problem Relation Age of Onset  . Hematuria Neg Hx   . Prostate cancer Neg Hx   . Kidney cancer Neg Hx     Social History Social History   Tobacco Use  . Smoking status: Never Smoker  . Smokeless tobacco: Never Used  Substance Use Topics  . Alcohol use: No    Alcohol/week: 0.0 standard drinks  . Drug use: No    Review of Systems  Constitutional: No fever/chills Eyes: No visual changes.  ENT: No sore throat. Cardiovascular: As above Respiratory: Denies shortness of breath. Gastrointestinal: No abdominal pain.  As above Genitourinary: Negative for dysuria. Musculoskeletal: Negative for back pain. Skin: Negative for rash. Neurological: Negative for headaches   ____________________________________________   PHYSICAL EXAM:  VITAL SIGNS: ED Triage Vitals  Enc Vitals  Group     BP 12/29/17 1020 (!) 165/68     Pulse Rate 12/29/17 1020 73     Resp 12/29/17 1020 18     Temp 12/29/17 1020 97.8 F (36.6 C)     Temp Source 12/29/17 1020 Oral     SpO2 12/29/17 1020 96 %     Weight 12/29/17 1027 83.9 kg (185 lb)     Height 12/29/17 1027 1.727 m (5\' 8" )     Head Circumference --      Peak Flow --      Pain Score 12/29/17 1027 0     Pain Loc --      Pain Edu? --      Excl. in GC? --     Constitutional: Alert and oriented. No  acute distress.  Eyes: Conjunctivae are normal.    Mouth/Throat: Mucous membranes are moist.    Cardiovascular: Normal rate, regular rhythm. Grossly normal heart sounds.  Good peripheral circulation. Respiratory: Normal respiratory effort.  No retractions. Lungs CTAB. Gastrointestinal: Soft and nontender. No distention.    Musculoskeletal: No lower extremity tenderness nor edema.  Warm and well perfused Neurologic:  Normal speech and language. No gross focal neurologic deficits are appreciated.  Skin:  Skin is warm, dry and intact. No rash noted. Psychiatric: Mood and affect are normal. Speech and behavior are normal.  ____________________________________________   LABS (all labs ordered are listed, but only abnormal results are displayed)  Labs Reviewed  BASIC METABOLIC PANEL - Abnormal; Notable for the following components:      Result Value   Sodium 132 (*)    Glucose, Bld 107 (*)    Calcium 8.8 (*)    All other components within normal limits  CBC  TROPONIN I  TROPONIN I   ____________________________________________  EKG  ED ECG REPORT I, Jene Every, the attending physician, personally viewed and interpreted this ECG.  Date: 12/29/2017  Rhythm: normal sinus rhythm QRS Axis: normal Intervals: normal ST/T Wave abnormalities: normal Narrative Interpretation: no evidence of acute ischemia  ____________________________________________  RADIOLOGY  Chest x-ray unremarkable CT angiography, negative for PE, discussed coronary artery disease, nodules with the patient and sons ____________________________________________   PROCEDURES  Procedure(s) performed: No  Procedures   Critical Care performed: No ____________________________________________   INITIAL IMPRESSION / ASSESSMENT AND PLAN / ED COURSE  Pertinent labs & imaging results that were available during my care of the patient were reviewed by me and considered in my medical decision making (see  chart for details).  Patient overall well-appearing in no acute distress.  A symptom medic in the emergency department.  EKG is reassuring.  Troponins negative x2.  Lab work otherwise unremarkable.  CT scan negative for PE, does demonstrate significant three-vessel disease, patient has appropriate outpatient follow-up arranged.  Unlikely to be a benefit to admission at this time given 2- troponins.  Discussed this with patient and he agrees, would like to be discharged with cardiology follow-up.  Strict return precautions discussed.    ____________________________________________   FINAL CLINICAL IMPRESSION(S) / ED DIAGNOSES  Final diagnoses:  Angina pectoris (HCC)        Note:  This document was prepared using Dragon voice recognition software and may include unintentional dictation errors.    Jene Every, MD 12/29/17 2245

## 2018-04-29 ENCOUNTER — Ambulatory Visit (INDEPENDENT_AMBULATORY_CARE_PROVIDER_SITE_OTHER): Payer: Medicare Other | Admitting: Urology

## 2018-04-29 ENCOUNTER — Encounter: Payer: Self-pay | Admitting: Urology

## 2018-04-29 VITALS — BP 146/77 | HR 69 | Ht 68.0 in | Wt 189.0 lb

## 2018-04-29 DIAGNOSIS — R351 Nocturia: Secondary | ICD-10-CM | POA: Diagnosis not present

## 2018-04-29 DIAGNOSIS — N138 Other obstructive and reflux uropathy: Secondary | ICD-10-CM | POA: Diagnosis not present

## 2018-04-29 DIAGNOSIS — N401 Enlarged prostate with lower urinary tract symptoms: Secondary | ICD-10-CM | POA: Diagnosis not present

## 2018-04-29 LAB — BLADDER SCAN AMB NON-IMAGING

## 2018-04-29 NOTE — Progress Notes (Signed)
04/29/2018 2:47 PM   Michaela Corner September 05, 1930 [id number]  Referring provider: Marguarite Arbour, MD [address] [facility] [place], [place] [address]  Chief Complaint  Patient presents with  . Nocturia    HPI: 85 year old male who returns today for routine follow-up.  He was previously seen and evaluated by my former partner, Dr. Hadley Pen last on 09/2016.  He has a personal history of BPH was previously managed on Flomax 0.8 mg and finasteride.  His only complains is nocturia x 3-4.  He can sleep about 3 hours the first time then 1-1.5 hours thereafter.    He does drink water to take his medication before bed.  He eats dinner 5-6 pm, sweet tea.  He goes to bed around 10 pm.    He does snore when he lays on is back.  He denies any lower extremity edema.    No UTIs or gross hematuria.   PMH: Past Medical History:  Diagnosis Date  . Acute retention of urine 12/14/2013  . Essential (primary) hypertension 12/14/2013  . Fracture of second cervical vertebra (HCC) 12/09/2013    Surgical History: Past Surgical History:  Procedure Laterality Date  . HERNIA REPAIR    . neck vertabrae      Home Medications:  Allergies as of 04/29/2018   No Known Allergies     Medication List       Accurate as of April 29, 2018 11:59 PM. Always use your most recent med list.        amLODipine 2.5 MG tablet Commonly known as:  NORVASC Take 2.5 mg by mouth daily.   finasteride 5 MG tablet Commonly known as:  PROSCAR Take 1 tablet (5 mg total) by mouth daily.   Garlic 705 MG Caps Take by mouth.   losartan-hydrochlorothiazide 100-25 MG tablet Commonly known as:  HYZAAR Take by mouth.   Omega-3 1000 MG Caps Take by mouth.   tamsulosin 0.4 MG Caps capsule Commonly known as:  FLOMAX Take 2 capsules (0.8 mg total) by mouth daily.   vitamin B-12 100 MCG tablet Commonly known as:  CYANOCOBALAMIN Take 100 mcg by mouth daily.   vitamin C 1000 MG tablet Take by  mouth. Reported on 02/10/2015   Vitamin D3 25 MCG (1000 UT) Caps Take by mouth.       Allergies: No Known Allergies  Family History: Family History  Problem Relation Age of Onset  . Hematuria Neg Hx   . Prostate cancer Neg Hx   . Kidney cancer Neg Hx     Social History:  reports that he has never smoked. He has never used smokeless tobacco. He reports that he does not drink alcohol or use drugs.  ROS: UROLOGY Frequent Urination?: Yes Hard to postpone urination?: No Burning/pain with urination?: No Get up at night to urinate?: Yes Leakage of urine?: No Urine stream starts and stops?: No Trouble starting stream?: No Do you have to strain to urinate?: No Blood in urine?: No Urinary tract infection?: No Sexually transmitted disease?: No Injury to kidneys or bladder?: No Painful intercourse?: No Weak stream?: No Erection problems?: Yes Penile pain?: No  Gastrointestinal Nausea?: No Vomiting?: No Indigestion/heartburn?: No Diarrhea?: No Constipation?: No  Constitutional Fever: No Night sweats?: No Weight loss?: No Fatigue?: No  Skin Skin rash/lesions?: No Itching?: No  Eyes Blurred vision?: No Double vision?: No  Ears/Nose/Throat Sore throat?: No Sinus problems?: No  Hematologic/Lymphatic Swollen glands?: No Easy bruising?: No  Cardiovascular Leg swelling?:  No Chest pain?: No  Respiratory Cough?: No Shortness of breath?: No  Endocrine Excessive thirst?: No  Musculoskeletal Back pain?: Yes Joint pain?: Yes  Neurological Headaches?: No Dizziness?: No  Psychologic Depression?: No Anxiety?: No  Physical Exam: BP (!) 146/77   Pulse 69   Ht 5\' 8"  (1.727 m)   Wt 189 lb (85.7 kg)   BMI 28.74 kg/m   Constitutional:  Alert and oriented, No acute distress. HEENT: [place] AT, moist mucus membranes.  Trachea midline, no masses. Cardiovascular: No clubbing, cyanosis, or edema. Respiratory: Normal respiratory effort, no increased work of  breathing. Skin: No rashes, bruises or suspicious lesions. Neurologic: Grossly intact, no focal deficits, moving all 4 extremities. Psychiatric: Normal mood and affect.  Laboratory Data: Lab Results  Component Value Date   WBC 8.7 12/29/2017   HGB 14.7 12/29/2017   HCT 42.1 12/29/2017   MCV 90.9 12/29/2017   PLT 189 12/29/2017    Lab Results  Component Value Date   CREATININE 0.89 12/29/2017   PSA 3.66 on 02/2018  UA negative care everywhere on 03/20/2018   Pertinent Imaging: Results for orders placed or performed in visit on 04/29/18  BLADDER SCAN AMB NON-IMAGING  Result Value Ref Range   Scan Result 16ml     Assessment & Plan:    1. BPH with obstruction/lower urinary tract symptoms Daytime symptoms fairly well controlled on finasteride and Flomax 0.8 mg We discussed possible side effects of this medication including my concern for orthostatic hypotension and falls He is not had any symptoms of this but advised just stop the medicine immediately if he experiences any of these symptoms We discussed alternatives to continue pharmacotherapy including surgical intervention which he is not interested at this time PSA appropriate for age, would defer further prostate cancer screening given his age and comorbidities Adequate bladder emptying today Recent urinalysis negative - BLADDER SCAN AMB NON-IMAGING  2. Nocturia Lengthy discussion today about nocturia Primarily recommend behavioral modification avoidance of beverages before bed We discussed additional factors which may be contributing to nighttime voiding episodes including possible undiagnosed sleep apnea, he will follow-up with his PCP discuss this further We discussed a final option in the form of of DDAVP or DDAVP like medication for nocturnal polyuria, declined after discussing possible side effects   Symptoms are relatively stable, he will follow-up as needed  Vanna Scotland, MD  [facility] [address] [place], [place] [address] [phone]

## 2018-06-12 ENCOUNTER — Telehealth: Payer: Self-pay | Admitting: Urology

## 2018-06-12 NOTE — Telephone Encounter (Signed)
Patient called the office and stated that Dr. Apolinar Junes gave him some samples for his prostate? He does not know what it was but he was instructed to call back if they helped. He wants her to know that they did help and would like a script called into the [facility] in [place] if possible.   Thanks, Scott Maynard

## 2018-06-15 NOTE — Telephone Encounter (Signed)
Pt states he was given samples at his last visit and is now requesting script, I do not see any documentation of this (perhaps I'm over looking). He was just seen on 04/29/2018. Do you have any recollection of this?

## 2018-06-15 NOTE — Telephone Encounter (Signed)
I believe I may have given him samples of Myrbetriq as he walked out the door unfortunately failed to document this.  Please call him and clarify whether or not he can recall if it was Myrbetriq.  If the packaging was yellow, it would be 25 mg.  If he is able to confirm this, lets call in the prescription for him.    If we end up calling in this prescription, I think is a good idea for him to come in in 6 weeks for PVR to ensure that he is emptying adequately on this medication.  Please arrange a nurse visit for this as well.  Vanna Scotland, MD

## 2018-06-16 MED ORDER — MIRABEGRON ER 25 MG PO TB24: 25.0000 mg | ORAL_TABLET | Freq: Every day | ORAL | 11 refills | Status: AC

## 2018-06-16 NOTE — Addendum Note (Signed)
Addended by: Anne Fu on: 06/16/2018 12:38 PM   Modules accepted: Orders

## 2018-06-16 NOTE — Telephone Encounter (Signed)
Returned call to patient. Myrbetriq 25mg  sent to [facility] [place]. Appt for PVR made for June. Patient satisfied and verbalized understanding.

## 2018-07-30 ENCOUNTER — Encounter: Payer: Self-pay | Admitting: Intensive Care

## 2018-07-30 ENCOUNTER — Other Ambulatory Visit: Payer: Self-pay

## 2018-07-30 ENCOUNTER — Emergency Department: Payer: Medicare Other

## 2018-07-30 ENCOUNTER — Emergency Department
Admission: EM | Admit: 2018-07-30 | Discharge: 2018-07-30 | Disposition: A | Discharge status: Home / Self Care | Payer: Medicare Other | Attending: Student in an Organized Health Care Education/Training Program | Admitting: Student in an Organized Health Care Education/Training Program

## 2018-07-30 DIAGNOSIS — Z79899 Other long term (current) drug therapy: Secondary | ICD-10-CM | POA: Diagnosis not present

## 2018-07-30 DIAGNOSIS — R110 Nausea: Secondary | ICD-10-CM | POA: Diagnosis present

## 2018-07-30 DIAGNOSIS — Z8719 Personal history of other diseases of the digestive system: Secondary | ICD-10-CM | POA: Diagnosis not present

## 2018-07-30 DIAGNOSIS — I10 Essential (primary) hypertension: Secondary | ICD-10-CM | POA: Diagnosis not present

## 2018-07-30 LAB — COMPREHENSIVE METABOLIC PANEL
ALT: 23 U/L (ref 0–44)
AST: 22 U/L (ref 15–41)
Albumin: 3.9 g/dL (ref 3.5–5.0)
Alkaline Phosphatase: 35 U/L — ABNORMAL LOW (ref 38–126)
Anion gap: 8 (ref 5–15)
BUN: 15 mg/dL (ref 8–23)
CO2: 25 mmol/L (ref 22–32)
Calcium: 9 mg/dL (ref 8.9–10.3)
Chloride: 100 mmol/L (ref 98–111)
Creatinine, Ser: 0.85 mg/dL (ref 0.61–1.24)
GFR calc Af Amer: >60 mL/min (ref >60)
GFR calc non Af Amer: >60 mL/min (ref >60)
Glucose, Bld: 102 mg/dL — ABNORMAL HIGH (ref 70–99)
Potassium: 3.9 mmol/L (ref 3.5–5.1)
Sodium: 133 mmol/L — ABNORMAL LOW (ref 135–145)
Total Bilirubin: 0.7 mg/dL (ref 0.3–1.2)
Total Protein: 6.9 g/dL (ref 6.5–8.1)

## 2018-07-30 LAB — URINALYSIS, COMPLETE (UACMP) WITH MICROSCOPIC
Bacteria, UA: NONE SEEN
Bilirubin Urine: NEGATIVE
Glucose, UA: NEGATIVE mg/dL
Hgb urine dipstick: NEGATIVE
Ketones, ur: NEGATIVE mg/dL
Leukocytes,Ua: NEGATIVE
Nitrite: NEGATIVE
Protein, ur: 30 mg/dL — AB
Specific Gravity, Urine: 1.018 (ref 1.005–1.030)
pH: 6 (ref 5.0–8.0)

## 2018-07-30 LAB — CBC
HCT: 39.3 % (ref 39.0–52.0)
Hemoglobin: 13.7 g/dL (ref 13.0–17.0)
MCH: 32.2 pg (ref 26.0–34.0)
MCHC: 34.9 g/dL (ref 30.0–36.0)
MCV: 92.5 fL (ref 80.0–100.0)
Platelets: 198 10*3/uL (ref 150–400)
RBC: 4.25 MIL/uL (ref 4.22–5.81)
RDW: 12.5 % (ref 11.5–15.5)
WBC: 8.4 10*3/uL (ref 4.0–10.5)
nRBC: 0 % (ref 0.0–0.2)

## 2018-07-30 LAB — LIPASE, BLOOD: Lipase: 25 U/L (ref 11–51)

## 2018-07-30 LAB — TROPONIN I: Troponin I: <0.03 ng/mL (ref <0.03)

## 2018-07-30 MED ORDER — SUCRALFATE 1 G PO TABS
1.0000 g | ORAL_TABLET | Freq: Once | ORAL | Status: AC
  Administered 2018-07-30: 1 g via ORAL
  Filled 2018-07-30: qty 1

## 2018-07-30 MED ORDER — PANTOPRAZOLE SODIUM 20 MG PO TBEC: 20.0000 mg | DELAYED_RELEASE_TABLET | Freq: Every day | ORAL | 0 refills | Status: AC

## 2018-07-30 MED ORDER — METOCLOPRAMIDE HCL 5 MG/ML IJ SOLN
5.0000 mg | Freq: Once | INTRAMUSCULAR | Status: AC
  Administered 2018-07-30: 5 mg via INTRAVENOUS
  Filled 2018-07-30: qty 2

## 2018-07-30 MED ORDER — IOHEXOL 300 MG/ML  SOLN
100.0000 mL | Freq: Once | INTRAMUSCULAR | Status: AC | PRN
  Administered 2018-07-30: 100 mL via INTRAVENOUS

## 2018-07-30 NOTE — ED Notes (Signed)
Pt to CT via stretcher accomp by CT tech 

## 2018-07-30 NOTE — ED Notes (Signed)
Pt uprite on stretcher in exam room with no distress noted, mask in place; pt reports nausea since Tuesday; denies pain, denies vomiting; abd soft/nondist/nontender; st similar episode last year and was placed on antibiotics for a "virus"; seen at urgent care and rx antinausea meds that provide only temporary relief

## 2018-07-30 NOTE — ED Triage Notes (Signed)
Patient c/o nausea since Tuesday afternoon. Saw his PCP and they gave him prescription for nausea medicine that works for about an hour but is not going away. Patient denies CP or SOB. Denies urinary problems

## 2018-07-30 NOTE — ED Provider Notes (Signed)
[facility] Emergency Department Provider Note    First MD Initiated Contact with Patient 07/30/18 2015     (approximate)  I have reviewed the triage vital signs and the nursing notes.   HISTORY  Chief Complaint Nausea    HPI Scott Maynard is a 85 y.o. male below listed past medical history presents the ER for 2 weeks of persistent nausea.  Was seen by PCP this past week and was given Zofran.  States that helped for several hours but it comes back.  Denies any pain.  No chest pain or shortness of breath.  No diarrhea.  Denies any vomiting but is having persistent nausea.  Denies any symptoms like this previously.  No numbness or tingling.  No headaches.    Past Medical History:  Diagnosis Date  . Acute retention of urine 12/14/2013  . Essential (primary) hypertension 12/14/2013  . Fracture of second cervical vertebra (HCC) 12/09/2013   Family History  Problem Relation Age of Onset  . Hematuria Neg Hx   . Prostate cancer Neg Hx   . Kidney cancer Neg Hx    Past Surgical History:  Procedure Laterality Date  . HERNIA REPAIR    . neck vertabrae     Patient Active Problem List   Diagnosis Date Noted  . 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 Intermittent urinary stream 02/10/2015  . Elevated PSA 02/10/2015  . Essential (primary) hypertension 12/14/2013  . Fracture of second cervical vertebra (HCC) 12/09/2013      Prior to Admission medications   Medication Sig Start Date End Date Taking? Authorizing Provider  amLODipine (NORVASC) 2.5 MG tablet Take 2.5 mg by mouth daily. 12/09/17   [provider]  Ascorbic Acid (VITAMIN C) 1000 MG tablet Take by mouth. Reported on 02/10/2015    [provider]  Cholecalciferol (VITAMIN D3) 1000 units CAPS Take by mouth.    [provider]  finasteride (PROSCAR) 5 MG tablet Take 1 tablet (5 mg total) by mouth daily. 07/19/16   Hildred Laser, MD  Garlic 705  MG CAPS Take by mouth.    [provider]  losartan-hydrochlorothiazide (HYZAAR) 100-25 MG tablet Take by mouth. 05/30/14   [provider]  mirabegron ER (MYRBETRIQ) 25 MG TB24 tablet Take 1 tablet (25 mg total) by mouth daily. 06/16/18   Vanna Scotland, MD  Omega-3 1000 MG CAPS Take by mouth.    [provider]  pantoprazole (PROTONIX) 20 MG tablet Take 1 tablet (20 mg total) by mouth daily. 07/30/18 07/30/19  Willy Eddy, MD  tamsulosin (FLOMAX) 0.4 MG CAPS capsule Take 2 capsules (0.8 mg total) by mouth daily. 07/19/16   Hildred Laser, MD  vitamin B-12 (CYANOCOBALAMIN) 100 MCG tablet Take 100 mcg by mouth daily.    [provider]    Allergies Patient has no known allergies.    Social History Social History   Tobacco Use  . Smoking status: Never Smoker  . Smokeless tobacco: Never Used  Substance Use Topics  . Alcohol use: No    Alcohol/week: 0.0 standard drinks  . Drug use: No    Review of Systems Patient denies headaches, rhinorrhea, blurry vision, numbness, shortness of breath, chest pain, edema, cough, abdominal pain, nausea, vomiting, diarrhea, dysuria, fevers, rashes or hallucinations unless otherwise stated above in HPI. ____________________________________________   PHYSICAL EXAM:  VITAL SIGNS: Vitals:   07/30/18 2030 07/30/18 2204  BP: (!) 176/79 (!) 187/89  Pulse: (!) 55 72  Resp:  18  Temp:    SpO2: 100% 97%    Constitutional: Alert and oriented.  Eyes: Conjunctivae are normal.  Head: Atraumatic. Nose: No congestion/rhinnorhea. Mouth/Throat: Mucous membranes are moist.   Neck: No stridor. Painless ROM.  Cardiovascular: Normal rate, regular rhythm. Grossly normal heart sounds.  Good peripheral circulation. Respiratory: Normal respiratory effort.  No retractions. Lungs CTAB. Gastrointestinal: Soft and nontender. No distention. No abdominal bruits. No CVA tenderness. Genitourinary:  Musculoskeletal: No lower  extremity tenderness nor edema.  No joint effusions. Neurologic:  Normal speech and language. No gross focal neurologic deficits are appreciated. No facial droop Skin:  Skin is warm, dry and intact. No rash noted. Psychiatric: Mood and affect are normal. Speech and behavior are normal.  ____________________________________________   LABS (all labs ordered are listed, but only abnormal results are displayed)  Results for orders placed or performed during the hospital encounter of 07/30/18 (from the past 24 hour(s))  Lipase, blood     Status: None   Collection Time: 07/30/18  4:52 PM  Result Value Ref Range   Lipase 25 11 - 51 U/L  Comprehensive metabolic panel     Status: Abnormal   Collection Time: 07/30/18  4:52 PM  Result Value Ref Range   Sodium 133 (L) 135 - 145 mmol/L   Potassium 3.9 3.5 - 5.1 mmol/L   Chloride 100 98 - 111 mmol/L   CO2 25 22 - 32 mmol/L   Glucose, Bld 102 (H) 70 - 99 mg/dL   BUN 15 8 - 23 mg/dL   Creatinine, Ser 1.610.85 0.61 - 1.24 mg/dL   Calcium 9.0 8.9 - 09.610.3 mg/dL   Total Protein 6.9 6.5 - 8.1 g/dL   Albumin 3.9 3.5 - 5.0 g/dL   AST 22 15 - 41 U/L   ALT 23 0 - 44 U/L   Alkaline Phosphatase 35 (L) 38 - 126 U/L   Total Bilirubin 0.7 0.3 - 1.2 mg/dL   GFR calc non Af Amer >60 >60 mL/min   GFR calc Af Amer >60 >60 mL/min   Anion gap 8 5 - 15  CBC     Status: None   Collection Time: 07/30/18  4:52 PM  Result Value Ref Range   WBC 8.4 4.0 - 10.5 K/uL   RBC 4.25 4.22 - 5.81 MIL/uL   Hemoglobin 13.7 13.0 - 17.0 g/dL   HCT 04.539.3 40.939.0 - 81.152.0 %   MCV 92.5 80.0 - 100.0 fL   MCH 32.2 26.0 - 34.0 pg   MCHC 34.9 30.0 - 36.0 g/dL   RDW 91.412.5 78.211.5 - 95.615.5 %   Platelets 198 150 - 400 K/uL   nRBC 0.0 0.0 - 0.2 %  Urinalysis, Complete w Microscopic     Status: Abnormal   Collection Time: 07/30/18  4:52 PM  Result Value Ref Range   Color, Urine YELLOW (A) YELLOW   APPearance CLEAR (A) CLEAR   Specific Gravity, Urine 1.018 1.005 - 1.030   pH 6.0 5.0 - 8.0    Glucose, UA NEGATIVE NEGATIVE mg/dL   Hgb urine dipstick NEGATIVE NEGATIVE   Bilirubin Urine NEGATIVE NEGATIVE   Ketones, ur NEGATIVE NEGATIVE mg/dL   Protein, ur 30 (A) NEGATIVE mg/dL   Nitrite NEGATIVE NEGATIVE   Leukocytes,Ua NEGATIVE NEGATIVE   WBC, UA 0-5 0 - 5 WBC/hpf   Bacteria, UA NONE SEEN NONE SEEN   Squamous Epithelial / LPF 0-5 0 - 5   Mucus PRESENT   Troponin I - Add-On to  previous collection     Status: None   Collection Time: 07/30/18  4:52 PM  Result Value Ref Range   Troponin I <0.03 <0.03 ng/mL   ____________________________________________  EKG My review and personal interpretation at Time: 21:05   Indication: nausea  Rate: 60  Rhythm: sinus Axis: normal Other: normal intervals, no stemi ____________________________________________  RADIOLOGY  I personally reviewed all radiographic images ordered to evaluate for the above acute complaints and reviewed radiology reports and findings.  These findings were personally discussed with the patient.  Please see medical record for radiology report.  ____________________________________________   PROCEDURES  Procedure(s) performed:  Procedures    Critical Care performed: no ____________________________________________   INITIAL IMPRESSION / ASSESSMENT AND PLAN / ED COURSE  Pertinent labs & imaging results that were available during my care of the patient were reviewed by me and considered in my medical decision making (see chart for details).   DDX: Enteritis, gastritis, pancreatitis, mass, SBO, colitis, ACS  Scott Maynard is a 85 y.o. who presents to the ED with symptoms as described above.  This is been ongoing for several days.  His abdominal exam is benign but somewhat distended and given his age and risk factors will order CT imaging.  EKG without any evidence of acute ischemia.  Blood will be sent for the by differential.  Doubt mesenteric ischemia.  Clinical Course as of Jul 30 2214  Thu Jul 30, 2018   2202 Patient reassessed and states he feels much improved.  Does have a history of heartburn and states that he felt like the Carafate did improve symptoms.  Denies any chest pain or shortness of breath.  CT imaging results were discussed with patient.  At this point I do believe he stable and appropriate for outpatient follow-up.  Have discussed with the patient and available family all diagnostics and treatments performed thus far and all questions were answered to the best of my ability. The patient demonstrates understanding and agreement with plan.    [PR]    Clinical Course User Index [PR] Willy Eddy, MD    The patient was evaluated in Emergency Department today for the symptoms described in the history of present illness. He/she was evaluated in the context of the global COVID-19 pandemic, which necessitated consideration that the patient might be at risk for infection with the SARS-CoV-2 virus that causes COVID-19. Institutional protocols and algorithms that pertain to the evaluation of patients at risk for COVID-19 are in a state of rapid change based on information released by regulatory bodies including the CDC and federal and state organizations. These policies and algorithms were followed during the patient's care in the ED.  As part of my medical decision making, I reviewed the following data within the electronic MEDICAL RECORD NUMBER Nursing notes reviewed and incorporated, Labs reviewed, notes from prior ED visits and [place] Controlled Substance Database   ____________________________________________   FINAL CLINICAL IMPRESSION(S) / ED DIAGNOSES  Final diagnoses:  Nausea      NEW MEDICATIONS STARTED DURING THIS VISIT:  New Prescriptions   PANTOPRAZOLE (PROTONIX) 20 MG TABLET    Take 1 tablet (20 mg total) by mouth daily.     Note:  This document was prepared using Dragon voice recognition software and may include unintentional dictation errors.    Willy Eddy,  MD 07/30/18 2217

## 2018-08-04 ENCOUNTER — Other Ambulatory Visit: Payer: Self-pay | Admitting: Internal Medicine

## 2018-08-04 DIAGNOSIS — K862 Cyst of pancreas: Secondary | ICD-10-CM

## 2018-08-05 ENCOUNTER — Ambulatory Visit (INDEPENDENT_AMBULATORY_CARE_PROVIDER_SITE_OTHER): Payer: Medicare Other

## 2018-08-05 ENCOUNTER — Other Ambulatory Visit: Payer: Self-pay

## 2018-08-05 DIAGNOSIS — N401 Enlarged prostate with lower urinary tract symptoms: Secondary | ICD-10-CM

## 2018-08-05 DIAGNOSIS — N138 Other obstructive and reflux uropathy: Secondary | ICD-10-CM | POA: Diagnosis not present

## 2018-08-05 NOTE — Progress Notes (Signed)
Patient present today for a follow up PVR  PVR today was 46ml  Patient states he cannot afford Myrbetriq and only took for one month. He states most of his urinary frequency is at night

## 2018-08-13 ENCOUNTER — Ambulatory Visit
Admission: RE | Admit: 2018-08-13 | Discharge: 2018-08-13 | Disposition: A | Discharge status: Home / Self Care | Payer: Medicare Other | Source: Ambulatory Visit | Attending: Internal Medicine | Admitting: Internal Medicine

## 2018-08-13 ENCOUNTER — Other Ambulatory Visit: Payer: Self-pay

## 2018-08-13 DIAGNOSIS — K862 Cyst of pancreas: Secondary | ICD-10-CM | POA: Diagnosis not present

## 2018-08-24 ENCOUNTER — Other Ambulatory Visit: Payer: Self-pay | Admitting: Physician Assistant

## 2018-08-24 DIAGNOSIS — E871 Hypo-osmolality and hyponatremia: Secondary | ICD-10-CM

## 2018-08-24 DIAGNOSIS — K862 Cyst of pancreas: Secondary | ICD-10-CM

## 2018-08-24 DIAGNOSIS — R110 Nausea: Secondary | ICD-10-CM

## 2018-09-02 ENCOUNTER — Other Ambulatory Visit: Payer: Self-pay | Admitting: Internal Medicine

## 2018-09-02 DIAGNOSIS — H539 Unspecified visual disturbance: Secondary | ICD-10-CM

## 2018-09-04 ENCOUNTER — Ambulatory Visit
Admission: RE | Admit: 2018-09-04 | Discharge: 2018-09-04 | Disposition: A | Discharge status: Home / Self Care | Payer: Medicare Other | Source: Ambulatory Visit | Attending: Physician Assistant | Admitting: Physician Assistant

## 2018-09-04 ENCOUNTER — Other Ambulatory Visit: Payer: Self-pay

## 2018-09-04 ENCOUNTER — Ambulatory Visit
Admission: RE | Admit: 2018-09-04 | Discharge: 2018-09-04 | Disposition: A | Discharge status: Home / Self Care | Payer: Medicare Other | Source: Ambulatory Visit | Attending: Internal Medicine | Admitting: Internal Medicine

## 2018-09-04 DIAGNOSIS — K862 Cyst of pancreas: Secondary | ICD-10-CM | POA: Diagnosis present

## 2018-09-04 DIAGNOSIS — R110 Nausea: Secondary | ICD-10-CM | POA: Insufficient documentation

## 2018-09-04 DIAGNOSIS — H539 Unspecified visual disturbance: Secondary | ICD-10-CM | POA: Insufficient documentation

## 2018-09-04 DIAGNOSIS — E871 Hypo-osmolality and hyponatremia: Secondary | ICD-10-CM | POA: Diagnosis present

## 2018-09-04 MED ORDER — GADOBUTROL 1 MMOL/ML IV SOLN
8.0000 mL | Freq: Once | INTRAVENOUS | Status: AC | PRN
  Administered 2018-09-04: 10:00:00 8 mL via INTRAVENOUS

## 2018-09-09 ENCOUNTER — Encounter: Payer: Self-pay | Admitting: Emergency Medicine

## 2018-09-09 ENCOUNTER — Other Ambulatory Visit: Payer: Self-pay

## 2018-09-09 ENCOUNTER — Emergency Department
Admission: EM | Admit: 2018-09-09 | Discharge: 2018-09-09 | Disposition: A | Discharge status: Home / Self Care | Payer: Medicare Other | Attending: Emergency Medicine | Admitting: Emergency Medicine

## 2018-09-09 DIAGNOSIS — R55 Syncope and collapse: Secondary | ICD-10-CM | POA: Diagnosis not present

## 2018-09-09 DIAGNOSIS — E871 Hypo-osmolality and hyponatremia: Secondary | ICD-10-CM | POA: Diagnosis not present

## 2018-09-09 DIAGNOSIS — I10 Essential (primary) hypertension: Secondary | ICD-10-CM | POA: Diagnosis not present

## 2018-09-09 LAB — TROPONIN I (HIGH SENSITIVITY): Troponin I (High Sensitivity): 3 ng/L (ref <18)

## 2018-09-09 LAB — COMPREHENSIVE METABOLIC PANEL
ALT: 15 U/L (ref 0–44)
AST: 20 U/L (ref 15–41)
Albumin: 4.1 g/dL (ref 3.5–5.0)
Alkaline Phosphatase: 30 U/L — ABNORMAL LOW (ref 38–126)
Anion gap: 8 (ref 5–15)
BUN: 15 mg/dL (ref 8–23)
CO2: 25 mmol/L (ref 22–32)
Calcium: 8.8 mg/dL — ABNORMAL LOW (ref 8.9–10.3)
Chloride: 96 mmol/L — ABNORMAL LOW (ref 98–111)
Creatinine, Ser: 1.1 mg/dL (ref 0.61–1.24)
GFR calc Af Amer: >60 mL/min (ref >60)
GFR calc non Af Amer: >60 mL/min (ref >60)
Glucose, Bld: 161 mg/dL — ABNORMAL HIGH (ref 70–99)
Potassium: 4 mmol/L (ref 3.5–5.1)
Sodium: 129 mmol/L — ABNORMAL LOW (ref 135–145)
Total Bilirubin: 0.7 mg/dL (ref 0.3–1.2)
Total Protein: 6.8 g/dL (ref 6.5–8.1)

## 2018-09-09 LAB — URINALYSIS, COMPLETE (UACMP) WITH MICROSCOPIC
Bacteria, UA: NONE SEEN
Bilirubin Urine: NEGATIVE
Glucose, UA: NEGATIVE mg/dL
Hgb urine dipstick: NEGATIVE
Ketones, ur: NEGATIVE mg/dL
Leukocytes,Ua: NEGATIVE
Nitrite: NEGATIVE
Protein, ur: 30 mg/dL — AB
Specific Gravity, Urine: 1.015 (ref 1.005–1.030)
pH: 7 (ref 5.0–8.0)

## 2018-09-09 LAB — CBC WITH DIFFERENTIAL/PLATELET
Abs Immature Granulocytes: 0.02 10*3/uL (ref 0.00–0.07)
Basophils Absolute: 0.1 10*3/uL (ref 0.0–0.1)
Basophils Relative: 1 %
Eosinophils Absolute: 0 10*3/uL (ref 0.0–0.5)
Eosinophils Relative: 0 %
HCT: 39.4 % (ref 39.0–52.0)
Hemoglobin: 13.8 g/dL (ref 13.0–17.0)
Immature Granulocytes: 0 %
Lymphocytes Relative: 18 %
Lymphs Abs: 1.5 10*3/uL (ref 0.7–4.0)
MCH: 32.2 pg (ref 26.0–34.0)
MCHC: 35 g/dL (ref 30.0–36.0)
MCV: 92.1 fL (ref 80.0–100.0)
Monocytes Absolute: 0.7 10*3/uL (ref 0.1–1.0)
Monocytes Relative: 9 %
Neutro Abs: 6 10*3/uL (ref 1.7–7.7)
Neutrophils Relative %: 72 %
Platelets: 170 10*3/uL (ref 150–400)
RBC: 4.28 MIL/uL (ref 4.22–5.81)
RDW: 12.7 % (ref 11.5–15.5)
WBC: 8.4 10*3/uL (ref 4.0–10.5)
nRBC: 0 % (ref 0.0–0.2)

## 2018-09-09 MED ORDER — LOSARTAN POTASSIUM 100 MG PO TABS: 100.0000 mg | ORAL_TABLET | Freq: Every day | ORAL | 11 refills | Status: DC

## 2018-09-09 MED ORDER — ONDANSETRON HCL 4 MG/2ML IJ SOLN
4.0000 mg | Freq: Once | INTRAMUSCULAR | Status: AC
  Administered 2018-09-09: 4 mg via INTRAVENOUS
  Filled 2018-09-09: qty 2

## 2018-09-09 MED ORDER — LOSARTAN POTASSIUM 100 MG PO TABS: 100.0000 mg | ORAL_TABLET | Freq: Every day | ORAL | 11 refills | Status: AC

## 2018-09-09 NOTE — ED Triage Notes (Signed)
Pt from home via EMS. Per EMS, pt was at a [employer] where he had a syncopal episode, LOC. Per EMS pt c/o N/genreal weakness x4 weeks. PTA EMS BP sitting 94/52; standing 84/42. PTA EMS gvien 4mg  Zofran and 129ml NS. EDP Williams at bedside. Pt A/O x4 upon arrival.

## 2018-09-09 NOTE — ED Notes (Addendum)
Pt st, "I was sitting down and when I stood up to write a check I felt dizzy". Pt denies dizziness, LOC, hitting his head, SHOB at this time. Pt able to recall events. Pt c/o nausea at this time.

## 2018-09-09 NOTE — ED Provider Notes (Signed)
[facility] Emergency Department Provider Note       Time seen: ----------------------------------------- 11:15 AM on 09/09/2018 -----------------------------------------   I have reviewed the triage vital signs and the nursing notes.  HISTORY   Chief Complaint Loss of Consciousness    HPI Scott Maynard is a 85 y.o. male with a history of hypertension, vertebral fracture who presents to the ED for syncope.  Patient arrives from home by EMS.  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  He has had generalized weakness with nausea for the past 4 weeks.  He was noted to be orthostatic on EMS arrival.  He was given Zofran and started on saline and brought to the ER for further evaluation.  He denies any other complaints.  Past Medical History:  Diagnosis Date  . Acute retention of urine 12/14/2013  . Essential (primary) hypertension 12/14/2013  . Fracture of second cervical vertebra ([place]) 12/09/2013    Patient Active Problem List   Diagnosis Date Noted  . 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 Intermittent urinary stream 02/10/2015  . Elevated PSA 02/10/2015  . Essential (primary) hypertension 12/14/2013  . Fracture of second cervical vertebra ([place]) 12/09/2013    Past Surgical History:  Procedure Laterality Date  . HERNIA REPAIR    . neck vertabrae      Allergies Patient has no known allergies.  Social History Social History   Tobacco Use  . Smoking status: Never Smoker  . Smokeless tobacco: Never Used  Substance Use Topics  . Alcohol use: No    Alcohol/week: 0.0 standard drinks  . Drug use: No   Review of Systems Constitutional: Negative for fever. Cardiovascular: Negative for chest pain. Respiratory: Negative for shortness of breath. Gastrointestinal: Negative for abdominal pain, positive for nausea Musculoskeletal: Negative for back pain. Skin: Negative for  rash. Neurological: Positive for weakness  All systems negative/normal/unremarkable except as stated in the HPI  ____________________________________________   PHYSICAL EXAM:  VITAL SIGNS: ED Triage Vitals  Enc Vitals Group     BP      Pulse      Resp      Temp      Temp src      SpO2      Weight      Height      Head Circumference      Peak Flow      Pain Score      Pain Loc      Pain Edu?      Excl. in [place]?    Constitutional: Alert and oriented. Well appearing and in no distress. Eyes: Conjunctivae are normal. Normal extraocular movements. ENT      Head: Normocephalic and atraumatic.      Nose: No congestion/rhinnorhea.      Mouth/Throat: Mucous membranes are moist.      Neck: No stridor. Cardiovascular: Normal rate, regular rhythm. No murmurs, rubs, or gallops. Respiratory: Normal respiratory effort without tachypnea nor retractions. Breath sounds are clear and equal bilaterally. No wheezes/rales/rhonchi. Gastrointestinal: Soft and nontender. Normal bowel sounds Musculoskeletal: Nontender with normal range of motion in extremities. No lower extremity tenderness nor edema. Neurologic:  Normal speech and language. No gross focal neurologic deficits are appreciated.  Skin:  Skin is warm, dry and intact. No rash noted. Psychiatric: Mood and affect are normal. Speech and behavior are normal.  ____________________________________________  EKG: Interpreted by me.  Sinus rhythm at a  rate of 67 bpm, borderline long PR interval, normal axis, normal QT  ____________________________________________  ED COURSE:  As part of my medical decision making, I reviewed the following data within the electronic MEDICAL RECORD NUMBER History obtained from family if available, nursing notes, old chart and ekg, as well as notes from prior ED visits. Patient presented for weakness and syncope, we will assess with labs and imaging as indicated at this time.   Procedures  Scott Maynard was  evaluated in Emergency Department on 09/09/2018 for the symptoms described in the history of present illness. He was evaluated in the context of the global COVID-19 pandemic, which necessitated consideration that the patient might be at risk for infection with the SARS-CoV-2 virus that causes COVID-19. Institutional protocols and algorithms that pertain to the evaluation of patients at risk for COVID-19 are in a state of rapid change based on information released by regulatory bodies including the CDC and federal and state organizations. These policies and algorithms were followed during the patient's care in the ED.  ____________________________________________   LABS (pertinent positives/negatives)  Labs Reviewed  COMPREHENSIVE METABOLIC PANEL - Abnormal; Notable for the following components:      Result Value   Sodium 129 (*)    Chloride 96 (*)    Glucose, Bld 161 (*)    Calcium 8.8 (*)    Alkaline Phosphatase 30 (*)    All other components within normal limits  URINALYSIS, COMPLETE (UACMP) WITH MICROSCOPIC - Abnormal; Notable for the following components:   Color, Urine YELLOW (*)    APPearance CLEAR (*)    Protein, ur 30 (*)    All other components within normal limits  CBC WITH DIFFERENTIAL/PLATELET  TROPONIN I (HIGH SENSITIVITY)   ____________________________________________   DIFFERENTIAL DIAGNOSIS   Dehydration, electrolyte abnormality, occult infection, arrhythmia, MI  FINAL ASSESSMENT AND PLAN  Syncope, hyponatremia   Plan: The patient had presented for syncope. Patient's labs did reveal mild hyponatremia.  He received a liter of fluids.  I may adjust his blood pressure medicine and have him take only losartan and not losartan with HCTZ and see how he feels.  He is cleared for outpatient follow-up.   Scott DashJohnathan E Marysol Wellnitz, MD    Note: This note was generated in part or whole with voice recognition software. Voice recognition is usually quite accurate but there are  transcription errors that can and very often do occur. I apologize for any typographical errors that were not detected and corrected.     Emily FilbertWilliams, Jakhiya Brower E, MD 09/09/18 [phone]

## 2018-09-10 ENCOUNTER — Ambulatory Visit: Payer: Medicare Other

## 2019-08-11 ENCOUNTER — Ambulatory Visit: Payer: Medicare Other | Admitting: Urology

## 2019-09-28 ENCOUNTER — Ambulatory Visit: Payer: Medicare Other | Admitting: Urology

## 2019-10-22 ENCOUNTER — Other Ambulatory Visit: Payer: Self-pay | Admitting: Emergency Medicine

## 2019-11-02 ENCOUNTER — Ambulatory Visit: Payer: Medicare Other | Admitting: Urology

## 2020-02-26 ENCOUNTER — Other Ambulatory Visit: Payer: Self-pay

## 2020-02-26 ENCOUNTER — Emergency Department: Payer: Medicare Other

## 2020-02-26 ENCOUNTER — Emergency Department
Admission: EM | Admit: 2020-02-26 | Discharge: 2020-02-26 | Disposition: A | Discharge status: Home / Self Care | Payer: Medicare Other | Attending: Emergency Medicine | Admitting: Emergency Medicine

## 2020-02-26 DIAGNOSIS — J069 Acute upper respiratory infection, unspecified: Secondary | ICD-10-CM | POA: Insufficient documentation

## 2020-02-26 DIAGNOSIS — J40 Bronchitis, not specified as acute or chronic: Secondary | ICD-10-CM

## 2020-02-26 DIAGNOSIS — Z20822 Contact with and (suspected) exposure to covid-19: Secondary | ICD-10-CM | POA: Insufficient documentation

## 2020-02-26 DIAGNOSIS — E871 Hypo-osmolality and hyponatremia: Secondary | ICD-10-CM | POA: Insufficient documentation

## 2020-02-26 DIAGNOSIS — Z79899 Other long term (current) drug therapy: Secondary | ICD-10-CM | POA: Diagnosis not present

## 2020-02-26 DIAGNOSIS — I10 Essential (primary) hypertension: Secondary | ICD-10-CM | POA: Diagnosis not present

## 2020-02-26 DIAGNOSIS — R059 Cough, unspecified: Secondary | ICD-10-CM | POA: Diagnosis present

## 2020-02-26 LAB — BASIC METABOLIC PANEL
Anion gap: 10 (ref 5–15)
Anion gap: 9 (ref 5–15)
BUN: 11 mg/dL (ref 8–23)
BUN: 10 mg/dL (ref 8–23)
CO2: 22 mmol/L (ref 22–32)
CO2: 24 mmol/L (ref 22–32)
Calcium: 8.3 mg/dL — ABNORMAL LOW (ref 8.9–10.3)
Calcium: 8.2 mg/dL — ABNORMAL LOW (ref 8.9–10.3)
Chloride: 91 mmol/L — ABNORMAL LOW (ref 98–111)
Chloride: 94 mmol/L — ABNORMAL LOW (ref 98–111)
Creatinine, Ser: 0.7 mg/dL (ref 0.61–1.24)
Creatinine, Ser: 0.74 mg/dL (ref 0.61–1.24)
GFR, Estimated: >60 mL/min (ref >60)
GFR, Estimated: >60 mL/min (ref >60)
Glucose, Bld: 188 mg/dL — ABNORMAL HIGH (ref 70–99)
Glucose, Bld: 107 mg/dL — ABNORMAL HIGH (ref 70–99)
Potassium: 3.5 mmol/L (ref 3.5–5.1)
Potassium: 3.5 mmol/L (ref 3.5–5.1)
Sodium: 123 mmol/L — ABNORMAL LOW (ref 135–145)
Sodium: 127 mmol/L — ABNORMAL LOW (ref 135–145)

## 2020-02-26 LAB — CBC
HCT: 35.4 % — ABNORMAL LOW (ref 39.0–52.0)
Hemoglobin: 12.7 g/dL — ABNORMAL LOW (ref 13.0–17.0)
MCH: 33.2 pg (ref 26.0–34.0)
MCHC: 35.9 g/dL (ref 30.0–36.0)
MCV: 92.4 fL (ref 80.0–100.0)
Platelets: 181 10*3/uL (ref 150–400)
RBC: 3.83 MIL/uL — ABNORMAL LOW (ref 4.22–5.81)
RDW: 12.1 % (ref 11.5–15.5)
WBC: 9.4 10*3/uL (ref 4.0–10.5)
nRBC: 0 % (ref 0.0–0.2)

## 2020-02-26 LAB — POC SARS CORONAVIRUS 2 AG -  ED
SARS Coronavirus 2 Ag: NEGATIVE
SARS Coronavirus 2 Ag: NEGATIVE

## 2020-02-26 MED ORDER — AZITHROMYCIN 250 MG PO TABS: ORAL_TABLET | ORAL | 0 refills | Status: AC

## 2020-02-26 MED ORDER — SODIUM CHLORIDE 0.9 % IV BOLUS
1000.0000 mL | Freq: Once | INTRAVENOUS | Status: AC
  Administered 2020-02-26: 1000 mL via INTRAVENOUS

## 2020-02-26 NOTE — ED Provider Notes (Signed)
Procedures     ----------------------------------------- 4:15 PM on 02/26/2020 -----------------------------------------  Patient feels better after IV fluids.  Repeat BMP shows sodium increased from 123-127 compared to chronic baseline of about 130.  Patient feels well and wants to go home which is reasonable.  Vital signs unremarkable.  Chest x-ray clear.  We will send a prescription for azithromycin, recommend follow-up with primary care, counseled him on regular eating and hydration.    Sharman Cheek, MD 02/26/20 [phone]

## 2020-02-26 NOTE — ED Notes (Signed)
Pt states began having sore throat and cough about 4d ago. Sore throat has resolved. Still has cough. Denies NVD. Denies hx smoking. Long-time wife did smoke and pt was exposed to secondhand smoke. Has received 3 covid vaccines and PNA vaccine. Fully alert and oriented at this time.

## 2020-02-26 NOTE — ED Provider Notes (Signed)
[facility] Emergency Department Provider Note  Time seen: 11:55 AM  I have reviewed the triage vital signs and the nursing notes.   HISTORY  Chief Complaint Nasal Congestion and Cough   HPI Scott Maynard is a 87 y.o. male with a past medical history of hypertension, presents to the emergency department for cough and congestion.  According to the patient for the past 4 days he has been experiencing a wet sounding cough feeling congested.  Patient denies any known fever.  Does state generalized fatigue as well.  No nausea vomiting or diarrhea but does state decreased appetite.   Past Medical History:  Diagnosis Date  . Acute retention of urine 12/14/2013  . Essential (primary) hypertension 12/14/2013  . Fracture of second cervical vertebra (HCC) 12/09/2013    Patient Active Problem List   Diagnosis Date Noted  . 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 Intermittent urinary stream 02/10/2015  . Elevated PSA 02/10/2015  . Essential (primary) hypertension 12/14/2013  . Fracture of second cervical vertebra (HCC) 12/09/2013    Past Surgical History:  Procedure Laterality Date  . HERNIA REPAIR    . neck vertabrae      Prior to Admission medications   Medication Sig Start Date End Date Taking? Authorizing Provider  amLODipine (NORVASC) 2.5 MG tablet Take 2.5 mg by mouth daily. 12/09/17   [provider]  Ascorbic Acid (VITAMIN C) 1000 MG tablet Take by mouth. Reported on 02/10/2015    [provider]  Cholecalciferol (VITAMIN D3) 1000 units CAPS Take by mouth.    [provider]  finasteride (PROSCAR) 5 MG tablet Take 1 tablet (5 mg total) by mouth daily. 07/19/16   Hildred Laser, MD  Garlic 705 MG CAPS Take by mouth.    [provider]  losartan (COZAAR) 100 MG tablet Take 1 tablet (100 mg total) by mouth daily. 09/09/18 09/09/19  Emily Filbert, MD  mirabegron ER (MYRBETRIQ) 25 MG TB24  tablet Take 1 tablet (25 mg total) by mouth daily. 06/16/18   Vanna Scotland, MD  Omega-3 1000 MG CAPS Take by mouth.    [provider]  pantoprazole (PROTONIX) 20 MG tablet Take 1 tablet (20 mg total) by mouth daily. 07/30/18 07/30/19  Willy Eddy, MD  tamsulosin (FLOMAX) 0.4 MG CAPS capsule Take 2 capsules (0.8 mg total) by mouth daily. 07/19/16   Hildred Laser, MD  vitamin B-12 (CYANOCOBALAMIN) 100 MCG tablet Take 100 mcg by mouth daily.    [provider]  losartan-hydrochlorothiazide (HYZAAR) 100-25 MG tablet Take by mouth. 05/30/14 09/09/18  [provider]    No Known Allergies  Family History  Problem Relation Age of Onset  . Hematuria Neg Hx   . Prostate cancer Neg Hx   . Kidney cancer Neg Hx     Social History Social History   Tobacco Use  . Smoking status: Never Smoker  . Smokeless tobacco: Never Used  Substance Use Topics  . Alcohol use: No    Alcohol/week: 0.0 standard drinks  . Drug use: No    Review of Systems Constitutional: Negative for fever. ENT: Positive for congestion Cardiovascular: Negative for chest pain. Respiratory: Negative for shortness of breath.  Positive for cough Gastrointestinal: Negative for abdominal pain, vomiting and diarrhea. Musculoskeletal: Negative for musculoskeletal complaints Neurological: Negative for headache All other ROS negative  ____________________________________________   PHYSICAL EXAM:  VITAL SIGNS: ED Triage Vitals  Enc Vitals Group     BP  02/26/20 0951 (!) 164/76     Pulse Rate 02/26/20 0951 94     Resp 02/26/20 0951 20     Temp 02/26/20 0951 98.9 F (37.2 C)     Temp Source 02/26/20 0951 Oral     SpO2 02/26/20 0951 96 %     Weight 02/26/20 0952 185 lb (83.9 kg)     Height 02/26/20 0952 5\' 8"  (1.727 m)     Head Circumference --      Peak Flow --      Pain Score 02/26/20 0952 0     Pain Loc --      Pain Edu? --      Excl. in [place]? --    Constitutional: Alert and  oriented. Well appearing and in no distress. Eyes: Normal exam ENT      Head: Normocephalic and atraumatic.      Nose: Mild rhinorrhea/congestion      Mouth/Throat: Mucous membranes are moist. Cardiovascular: Normal rate, regular rhythm. Respiratory: Normal respiratory effort without tachypnea nor retractions. Breath sounds are clear.  Occasional wet sounding cough. Gastrointestinal: Soft and nontender. No distention.   Musculoskeletal: Nontender with normal range of motion in all extremities. No lower extremity tenderness Neurologic:  Normal speech and language. No gross focal neurologic deficits  Skin:  Skin is warm, dry and intact.  Psychiatric: Mood and affect are normal.   ____________________________________________   RADIOLOGY  Chest x-ray is negative.  ____________________________________________   INITIAL IMPRESSION / ASSESSMENT AND PLAN / ED COURSE  Pertinent labs & imaging results that were available during my care of the patient were reviewed by me and considered in my medical decision making (see chart for details).   Patient presents emergency department for cough and congestion over the past 4 days.  No nausea vomiting or fever.  Patient does state general fatigue on review of systems questioning.  Patient's labs have shown fairly significant hyponatremia at 123.  In reviewing the patient's records he has had low sodium levels in the past but never this low, usually around 130-133.  Given the patient's cough and congestion we will check a Covid swab.  Reassuringly patient's lung sounds are clear and chest x-ray is clear.  Given the patient's hyponatremia we will dose a liter of normal saline and continue to closely monitor.  Patient strongly wishes to go home we will recheck sodium after IV fluids.  Covid negative.  Repeat BMP pending.  Patient care signed out to oncoming provider.  04/25/20 was evaluated in Emergency Department on 02/26/2020 for the symptoms described  in the history of present illness. He was evaluated in the context of the global COVID-19 pandemic, which necessitated consideration that the patient might be at risk for infection with the SARS-CoV-2 virus that causes COVID-19. Institutional protocols and algorithms that pertain to the evaluation of patients at risk for COVID-19 are in a state of rapid change based on information released by regulatory bodies including the CDC and federal and state organizations. These policies and algorithms were followed during the patient's care in the ED.  ____________________________________________   FINAL CLINICAL IMPRESSION(S) / ED DIAGNOSES  Hyponatremia Upper respiratory infection   04/25/2020, MD 02/26/20 1447

## 2020-02-26 NOTE — ED Notes (Signed)
EDP aware of sodium level on BMP. EDP was at bedside of pt speaking with pt.

## 2020-02-26 NOTE — ED Triage Notes (Signed)
Pt comes pov with cough, chest congestion for about 4 days. Has been taking OTC meds with no relief. Voice is hoarse. Fully vaccinated.

## 2020-07-19 IMAGING — MR MRI ABDOMEN WITH AND WITHOUT CONTRAST
22 of 23 series · 45 of 48 positions shown · IV contrast (gadavist)
Comparison: 07/30/2018

CLINICAL DATA: Evaluate pancreas lesion

EXAM:
MRI ABDOMEN WITHOUT AND WITH CONTRAST
TECHNIQUE: Multiplanar multisequence MR imaging of the abdomen was performed
both before and after the administration of intravenous contrast.
CONTRAST:  8 cc Gadavist

[Series 3: cor haste · coronal · 6.0mm · 1.19mm/px · 2 of 32 slices shown]
[im 1/32]
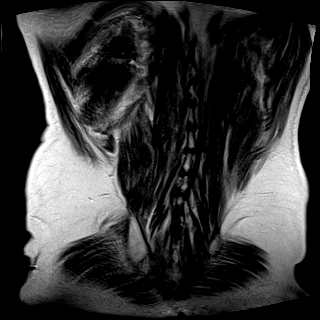
[im 32/32]
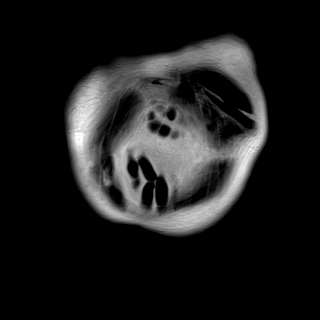

[Series 4: ax haste · axial · 6.0mm · 1.19mm/px · z∈[-595,-372]mm · 2 of 32 slices shown]
[im 1/32]
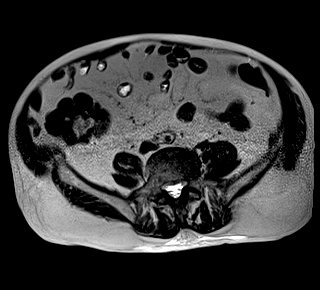
[im 32/32]
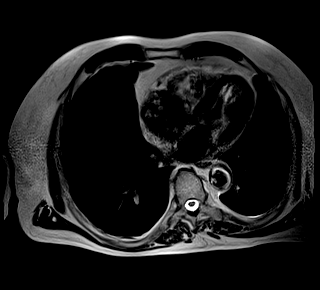

[Series 7: T2 fat-sat · axial · 6.0mm · 1.19mm/px · z∈[-552,-343]mm · 2 of 30 slices shown]
[im 1/30]
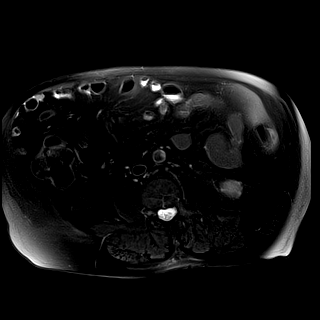
[im 30/30]
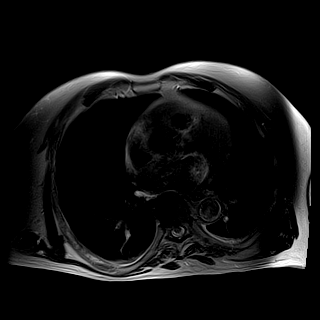

[Series 8: ax dwi_tracew · axial · 6.0mm · 1.42mm/px · 1 of 30 slices shown (1 of 3)]
[im 1/30]
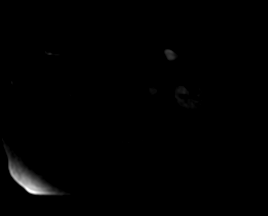

[Series 8: ax dwi_tracew · axial · 6.0mm · 1.42mm/px · 1 of 30 slices shown (2 of 3)]
[im 1/30]
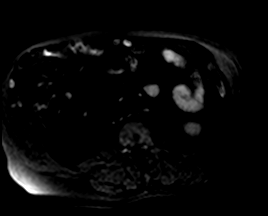

[Series 8: ax dwi_tracew · axial · 6.0mm · 1.42mm/px · 1 of 30 slices shown (3 of 3)]
[im 1/30]
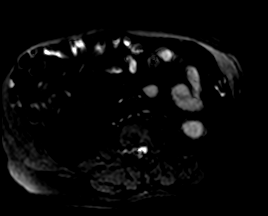

[Series 9: ax dwi_adc · axial · 6.0mm · 1.42mm/px · 1 of 30 slices shown]
[im 1/30]
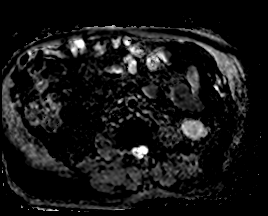

[Series 10: T1 · axial · 6.0mm · 0.74mm/px · 1 of 32 slices shown (1 of 2)]
[im 1/32]
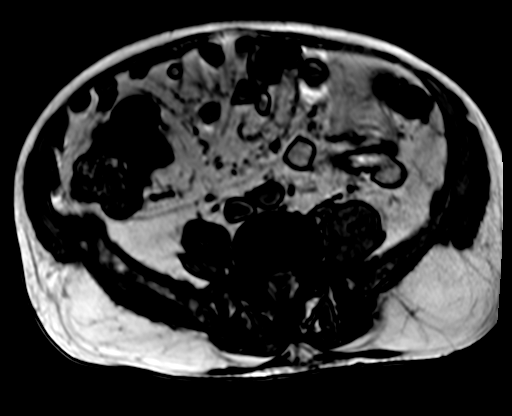

[Series 10: T1 · axial · 6.0mm · 0.74mm/px · 1 of 32 slices shown (2 of 2)]
[im 1/32]
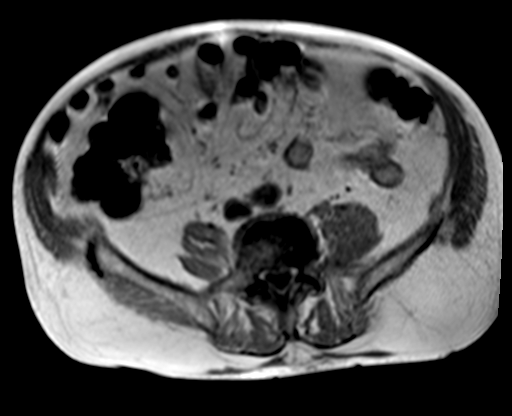

[Series 14: bSSFP · axial · 6.0mm · 0.74mm/px · 1 of 32 slices shown]
[im 1/32]
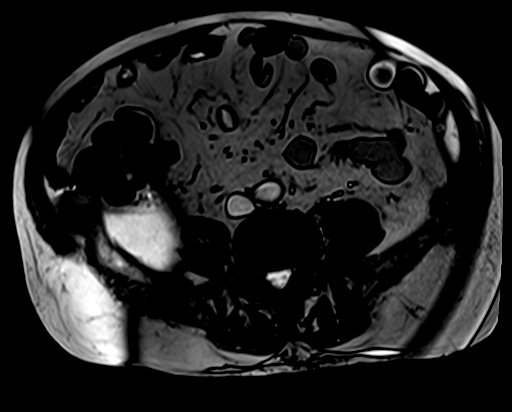

[Series 15: T1 dynamic fat-sat · axial · non-contrast · 3.0mm · 1.19mm/px · z∈[-566,-353]mm · 3 of 72 slices shown (1 of 5)]
[im 1/72]
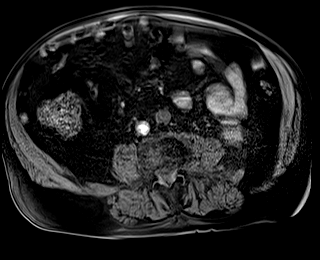
[im 36/72]
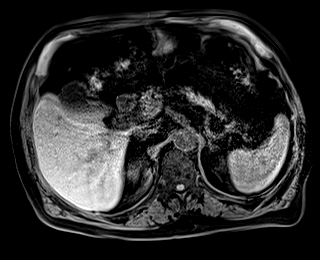
[im 72/72]
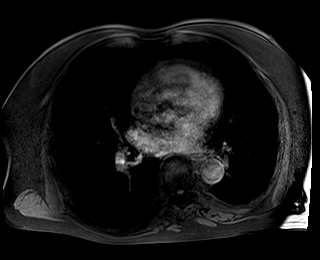

[Series 16: T1 dynamic fat-sat post-contrast · axial · 3.0mm · 1.19mm/px · z∈[-566,-353]mm · 3 of 72 slices shown (1 of 4)]
[im 1/72]
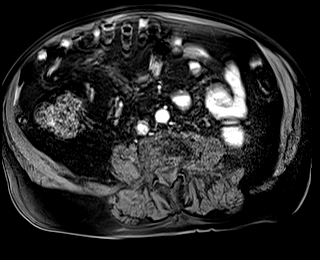
[im 36/72]
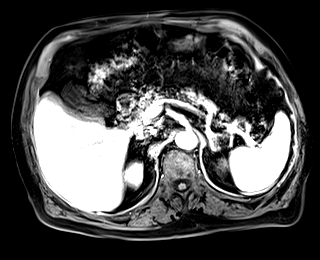
[im 72/72]
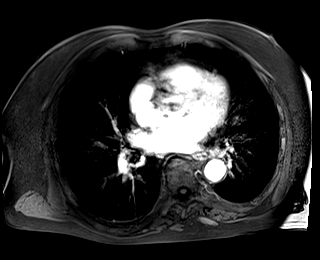

[Series 17: T1 dynamic fat-sat · axial · 3.0mm · 1.19mm/px · z∈[-566,-353]mm · 3 of 72 slices shown (2 of 5)]
[im 1/72]
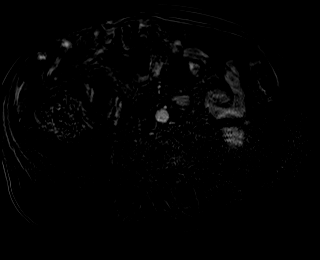
[im 36/72]
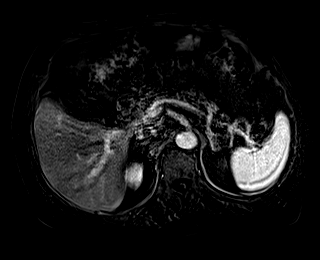
[im 72/72]
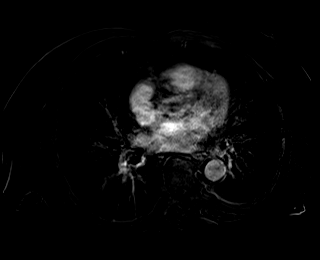

[Series 18: T1 dynamic fat-sat post-contrast · axial · 3.0mm · 1.19mm/px · z∈[-566,-353]mm · 3 of 72 slices shown (2 of 4)]
[im 1/72]
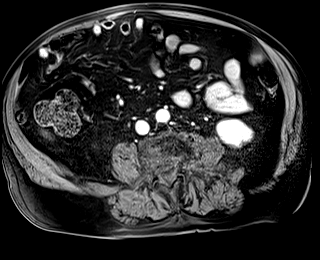
[im 36/72]
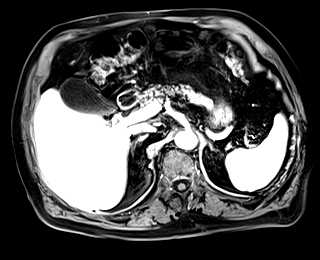
[im 72/72]
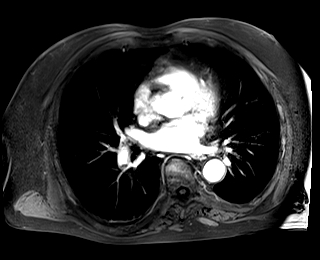

[Series 19: T1 dynamic fat-sat · axial · 3.0mm · 1.19mm/px · z∈[-566,-353]mm · 3 of 72 slices shown (3 of 5)]
[im 1/72]
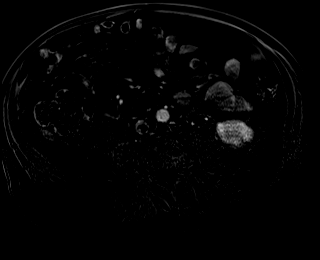
[im 36/72]
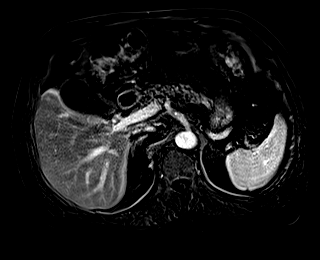
[im 72/72]
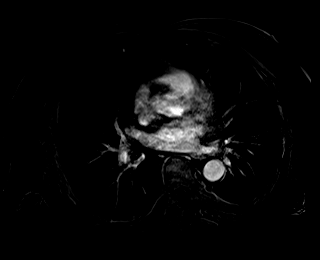

[Series 20: T1 dynamic fat-sat post-contrast · axial · 3.0mm · 1.19mm/px · z∈[-566,-353]mm · 3 of 72 slices shown (3 of 4)]
[im 1/72]
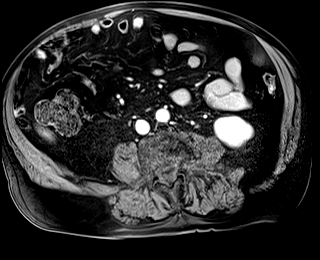
[im 36/72]
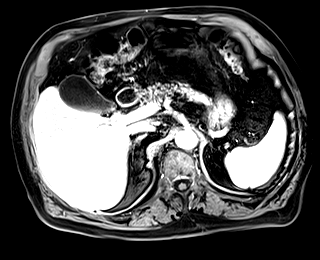
[im 72/72]
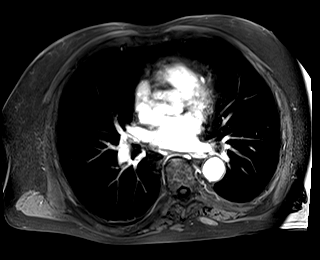

[Series 21: T1 dynamic fat-sat · axial · 3.0mm · 1.19mm/px · z∈[-566,-353]mm · 3 of 72 slices shown (4 of 5)]
[im 1/72]
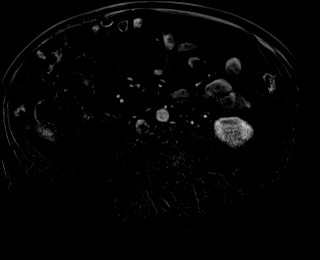
[im 36/72]
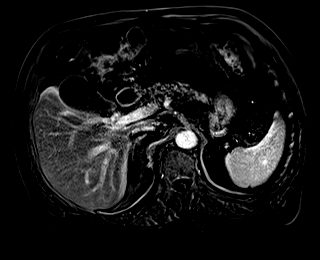
[im 72/72]
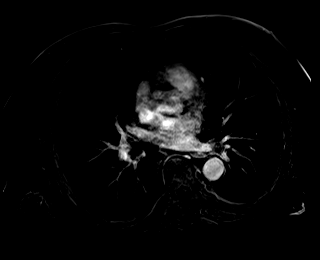

[Series 22: T1 dynamic post-contrast · coronal · 3.0mm · 1.31mm/px · 3 of 80 slices shown]
[im 1/80]
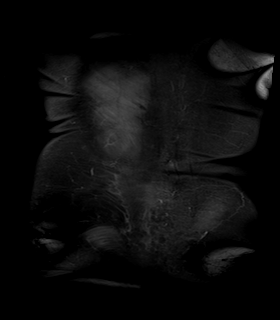
[im 40/80]
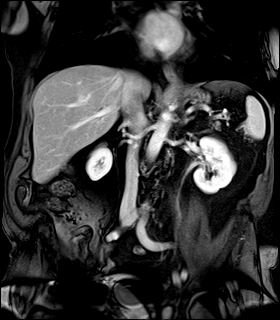
[im 80/80]
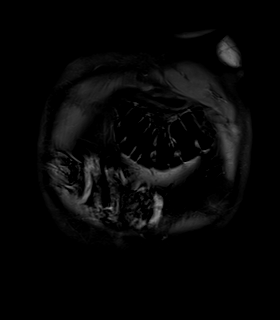

[Series 23: T1 dynamic fat-sat post-contrast · axial · 3.0mm · 1.19mm/px · z∈[-566,-353]mm · 3 of 72 slices shown (4 of 4)]
[im 1/72]
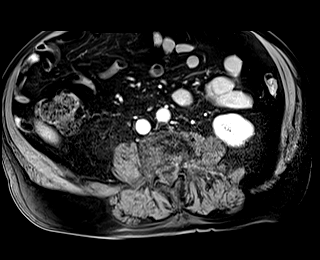
[im 36/72]
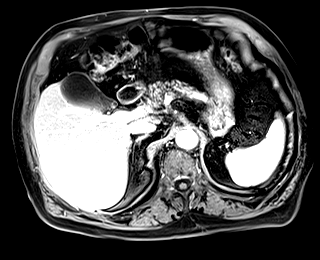
[im 72/72]
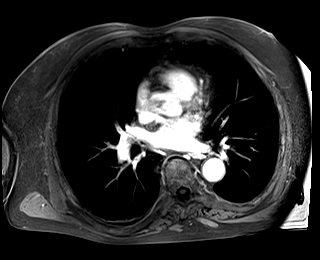

[Series 24: T1 dynamic fat-sat · axial · 3.0mm · 1.19mm/px · z∈[-566,-353]mm · 3 of 72 slices shown (5 of 5)]
[im 1/72]
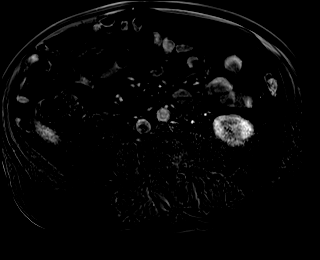
[im 36/72]
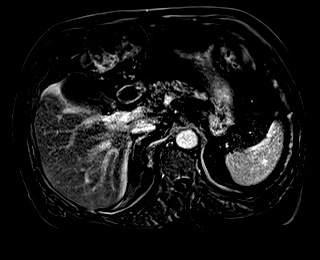
[im 72/72]
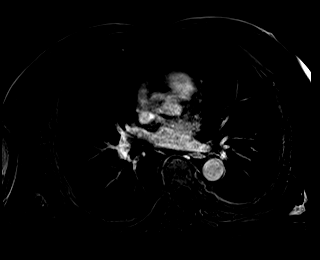

[Series 1036: MRCP · sagittal · 0.6mm · 0.47mm/px · 1 of 19 slices shown (1 of 2)]
[im 1/19]
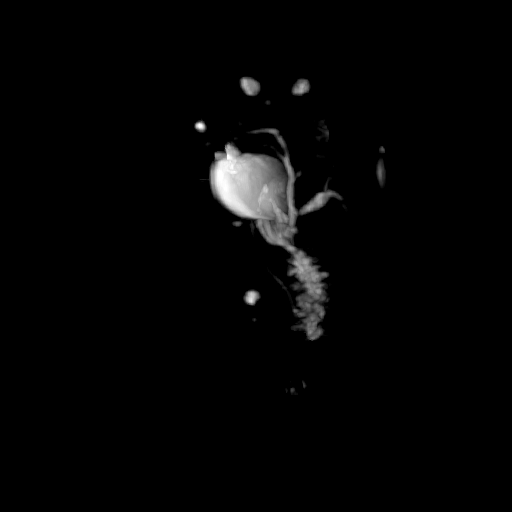

[Series 1036: MRCP · axial · 0.7mm · 0.65mm/px · 1 of 2 slices shown (2 of 2)]
[im 1/2]
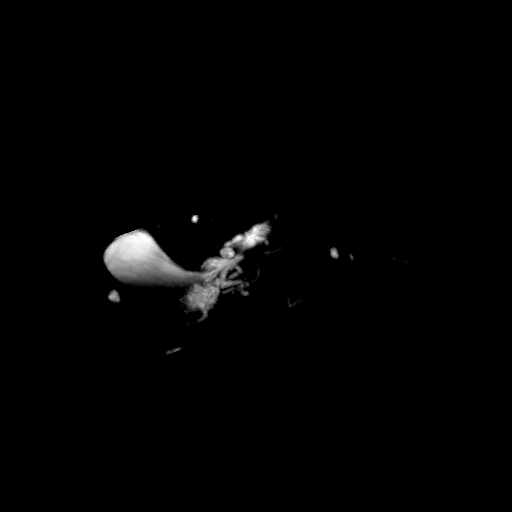

[45 of 48 positions shown; findings below may reference images not displayed]

FINDINGS: Lower chest: No acute findings.

Hepatobiliary: There are several simple cysts. These measure up to 1
cm. No suspicious enhancing liver lesions identified. Gallbladder is
normal. No biliary ductal dilatation.

Pancreas: No pancreatic inflammation or solid enhancing mass
identified. No main duct dilatation.

Several well-circumscribed, nonaggressive appearing cystic lesions
are identified within the pancreas:

-unilocular cystic lesion in distal tail measures 1 cm, image [DATE].

-Within the body of pancreas there is a unilocular cystic lesion
measuring 7 mm, image [DATE].

-Within the head of pancreas there is a 7 mm cystic lesion, image
[DATE].

-Within the posterior head of pancreas there is a 5 mm cystic
lesion, image [DATE].

Spleen:  Within normal limits in size and appearance.

Adrenals/Urinary Tract: No masses identified. No evidence of
hydronephrosis. Nonenhancing cyst within lower pole of right kidney
measures 1.3 cm. No enhancing mass or hydronephrosis.

Stomach/Bowel: Visualized portions within the abdomen are
unremarkable.

Vascular/Lymphatic: Aortic atherosclerosis. No aneurysm. No enlarged
lymph nodes.

Other:  None.

Musculoskeletal: No suspicious bone lesions identified. Spondylosis
and scoliosis noted within the lumbar spine.
IMPRESSION: 1. Multiple nonaggressive appearing unilocular cystic lesions
identified within the pancreas. According to consensus criteria for
cyst less than 2.5 cm follow-up imaging in 24 months is recommended.
At this time the study of choice would be a contrast enhanced
pancreas protocol MRI. This recommendation follows ACR consensus
guidelines: Management of Incidental Pancreatic Cysts: A White Paper
of the ACR Incidental Findings Committee. [HOSPITAL]
2. Liver and right kidney cysts.
3.  Aortic Atherosclerosis (Z71TD-IJ5.5).

## 2022-04-12 ENCOUNTER — Other Ambulatory Visit: Payer: Self-pay | Admitting: Student

## 2022-04-12 DIAGNOSIS — I6529 Occlusion and stenosis of unspecified carotid artery: Secondary | ICD-10-CM

## 2022-04-12 DIAGNOSIS — I6523 Occlusion and stenosis of bilateral carotid arteries: Secondary | ICD-10-CM

## 2022-04-20 ENCOUNTER — Ambulatory Visit
Admission: RE | Admit: 2022-04-20 | Discharge: 2022-04-20 | Disposition: A | Discharge status: Home / Self Care | Payer: Medicare Other | Source: Ambulatory Visit | Attending: Student | Admitting: Student

## 2022-04-20 DIAGNOSIS — I6529 Occlusion and stenosis of unspecified carotid artery: Secondary | ICD-10-CM | POA: Diagnosis present

## 2022-04-20 DIAGNOSIS — I6523 Occlusion and stenosis of bilateral carotid arteries: Secondary | ICD-10-CM | POA: Diagnosis present
